# Patient Record
Sex: Male | Born: 1980
Health system: Southern US, Community
[De-identification: ages and names within clinical notes are randomized; demographics above are authoritative.]

## PROBLEM LIST (undated history)

## (undated) DIAGNOSIS — Z21 Asymptomatic human immunodeficiency virus [HIV] infection status: Secondary | ICD-10-CM

## (undated) DIAGNOSIS — F17201 Nicotine dependence, unspecified, in remission: Secondary | ICD-10-CM

## (undated) DIAGNOSIS — B20 Human immunodeficiency virus [HIV] disease: Secondary | ICD-10-CM

## (undated) DIAGNOSIS — Z889 Allergy status to unspecified drugs, medicaments and biological substances status: Secondary | ICD-10-CM

## (undated) HISTORY — DX: Allergy status to unspecified drugs, medicaments and biological substances: Z88.9

## (undated) HISTORY — DX: Nicotine dependence, unspecified, in remission: F17.201

## (undated) HISTORY — PX: OTHER SURGICAL HISTORY: SHX169

## (undated) HISTORY — DX: Asymptomatic human immunodeficiency virus (hiv) infection status: Z21

## (undated) HISTORY — DX: Human immunodeficiency virus (HIV) disease: B20

---

## 2014-05-07 ENCOUNTER — Ambulatory Visit (INDEPENDENT_AMBULATORY_CARE_PROVIDER_SITE_OTHER): Payer: PRIVATE HEALTH INSURANCE | Admitting: Nurse Practitioner

## 2014-05-07 ENCOUNTER — Ambulatory Visit: Payer: Self-pay | Admitting: Nurse Practitioner

## 2014-05-07 ENCOUNTER — Encounter: Payer: Self-pay | Admitting: Nurse Practitioner

## 2014-05-07 VITALS — BP 134/79 | HR 71 | Temp 98.0°F | Resp 18 | Ht 66.5 in | Wt 185.0 lb

## 2014-05-07 DIAGNOSIS — B20 Human immunodeficiency virus [HIV] disease: Secondary | ICD-10-CM

## 2014-05-07 MED ORDER — EMTRICITAB-RILPIVIR-TENOFOV DF 200-25-300 MG PO TABS: 1.0000 | ORAL_TABLET | Freq: Every day | ORAL | Status: DC

## 2014-05-07 NOTE — Progress Notes (Signed)
Pre visit review using our clinic review tool, if applicable. No additional management support is needed unless otherwise documented below in the visit note. 

## 2014-05-07 NOTE — Patient Instructions (Signed)
We will request records.   I will refer to infectious disease. They will call you to schedule appointment.  Develop lifelong habits of exercise most days of the week: take a 30 minute walk. The benefits include weight loss, lower risk for heart disease, diabetes, stroke, high blood pressure, lower rates of depression & dementia, better sleep quality & bone health.  Nice to meet you!

## 2014-05-07 NOTE — Progress Notes (Signed)
Subjective:     Kevin Keller is a 33 y.o. male and is here to establish care. The patient reports problems - he is currently treated for HIV/AIDS. He is on complera. Historical treatment was at [facility] Dr Kevin Keller in [place]. He had labs completed 03/2014 & brought those for review today. He has 10 days left of meds and is very concerned about running out. He feels well. WBC 3.9, Cd4 376, Cd8 679. HAV reactive-pt not sure if had Hep A vaccine, thinks had Hep B series. No hep B or C screen in labs. Tb Gold neg, GC & syphillis neg. CMET & lipids OK. He is working FT on weekends & going to school during the week. He is studying nursing at [employer].  History   Social History  . Marital Status: Single    Spouse Name: N/A    Number of Children: 0  . Years of Education: N/A   Occupational History  .     Social History Main Topics  . Smoking status: Light Tobacco Smoker  . Smokeless tobacco: Not on file  . Alcohol Use: Yes     Comment: occasionally  . Drug Use: Not on file  . Sexual Activity: Not on file   Other Topics Concern  . Not on file   Social History Narrative   Kevin Keller works FT at [employer]. He is also a Theatre stage manager at [employer].      He lives w/2 roommates.   He recently re-located from [place].         Health Maintenance  Topic Date Due  . Influenza Vaccine  04/04/2015  . Tetanus/tdap  05/07/2021    The following portions of the patient's history were reviewed and updated as appropriate: allergies, current medications, past family history, past medical history, past social history, past surgical history and problem list.  Review of Systems Pertinent items are noted in HPI.   Objective:    BP 134/79  Pulse 71  Temp(Src) 98 F (36.7 C) (Oral)  Resp 18  Ht 5' 6.5" (1.689 m)  Wt 185 lb (83.915 kg)  BMI 29.42 kg/m2  SpO2 99% General appearance: alert, cooperative, appears stated age and no distress Head: Normocephalic,  without obvious abnormality, atraumatic Eyes: negative findings: lids and lashes normal, conjunctivae and sclerae normal and wearing glasses Ears: L tm vessels slightly injected, bones visible . RTM nml Throat: lips, mucosa, and tongue normal; teeth and gums normal Lungs: clear to auscultation bilaterally Heart: regular rate and rhythm, S1, S2 normal, no murmur, click, rub or gallop Abdomen: soft, non-tender; bowel sounds normal; no masses,  no organomegaly Lymph nodes: Cervical, supraclavicular, and axillary nodes normal.    Assessment:  1. Human immunodeficiency virus (HIV) disease - Ambulatory referral to Infectious Disease - Emtricitab-Rilpivir-Tenofovir (COMPLERA) 200-25-300 MG TABS; Take 1 tablet by mouth daily.  Dispense: 30 tablet; Refill: 1  F/u 6 weeks.

## 2014-05-11 ENCOUNTER — Telehealth: Payer: Self-pay

## 2014-05-11 NOTE — Telephone Encounter (Signed)
Primary Care referral requesting new HIV appointment.    Message left on voice mail to call our office and schedule intake.   Laurell Josephs, RN

## 2014-06-10 ENCOUNTER — Other Ambulatory Visit: Payer: PRIVATE HEALTH INSURANCE

## 2014-06-10 DIAGNOSIS — B20 Human immunodeficiency virus [HIV] disease: Secondary | ICD-10-CM

## 2014-06-10 LAB — CBC WITH DIFFERENTIAL/PLATELET
BASOS ABS: 0 10*3/uL (ref 0.0–0.1)
Basophils Relative: 1 % (ref 0–1)
EOS PCT: 2 % (ref 0–5)
Eosinophils Absolute: 0.1 10*3/uL (ref 0.0–0.7)
HEMATOCRIT: 47.1 % (ref 39.0–52.0)
Hemoglobin: 16.3 g/dL (ref 13.0–17.0)
Lymphocytes Relative: 37 % (ref 12–46)
Lymphs Abs: 1.5 10*3/uL (ref 0.7–4.0)
MCH: 32.6 pg (ref 26.0–34.0)
MCHC: 34.6 g/dL (ref 30.0–36.0)
MCV: 94.2 fL (ref 78.0–100.0)
MONO ABS: 0.4 10*3/uL (ref 0.1–1.0)
Monocytes Relative: 10 % (ref 3–12)
NEUTROS ABS: 2 10*3/uL (ref 1.7–7.7)
Neutrophils Relative %: 50 % (ref 43–77)
PLATELETS: 224 10*3/uL (ref 150–400)
RBC: 5 MIL/uL (ref 4.22–5.81)
RDW: 13.2 % (ref 11.5–15.5)
WBC: 4 10*3/uL (ref 4.0–10.5)

## 2014-06-11 LAB — COMPREHENSIVE METABOLIC PANEL
ALK PHOS: 74 U/L (ref 39–117)
ALT: 34 U/L (ref 0–53)
AST: 21 U/L (ref 0–37)
Albumin: 4.1 g/dL (ref 3.5–5.2)
BUN: 13 mg/dL (ref 6–23)
CO2: 29 mEq/L (ref 19–32)
Calcium: 9.1 mg/dL (ref 8.4–10.5)
Chloride: 104 mEq/L (ref 96–112)
Creat: 0.96 mg/dL (ref 0.50–1.35)
Glucose, Bld: 84 mg/dL (ref 70–99)
POTASSIUM: 4.1 meq/L (ref 3.5–5.3)
Sodium: 141 mEq/L (ref 135–145)
Total Bilirubin: 0.5 mg/dL (ref 0.2–1.2)
Total Protein: 7.1 g/dL (ref 6.0–8.3)

## 2014-06-11 LAB — T-HELPER CELL (CD4) - (RCID CLINIC ONLY)
CD4 % Helper T Cell: 28 % — ABNORMAL LOW (ref 33–55)
CD4 T CELL ABS: 480 /uL (ref 400–2700)

## 2014-06-11 LAB — HIV-1 RNA ULTRAQUANT REFLEX TO GENTYP+: HIV 1 RNA Quant: <20 copies/mL (ref <20)

## 2014-06-18 ENCOUNTER — Ambulatory Visit (INDEPENDENT_AMBULATORY_CARE_PROVIDER_SITE_OTHER): Payer: PRIVATE HEALTH INSURANCE | Admitting: Nurse Practitioner

## 2014-06-18 ENCOUNTER — Encounter: Payer: Self-pay | Admitting: Nurse Practitioner

## 2014-06-18 VITALS — BP 122/76 | HR 71 | Temp 98.4°F | Ht 66.5 in | Wt 186.2 lb

## 2014-06-18 DIAGNOSIS — R05 Cough: Secondary | ICD-10-CM

## 2014-06-18 DIAGNOSIS — F17210 Nicotine dependence, cigarettes, uncomplicated: Secondary | ICD-10-CM | POA: Insufficient documentation

## 2014-06-18 DIAGNOSIS — Z23 Encounter for immunization: Secondary | ICD-10-CM

## 2014-06-18 DIAGNOSIS — R059 Cough, unspecified: Secondary | ICD-10-CM

## 2014-06-18 DIAGNOSIS — Z720 Tobacco use: Secondary | ICD-10-CM

## 2014-06-18 DIAGNOSIS — Z020 Encounter for examination for admission to educational institution: Secondary | ICD-10-CM | POA: Insufficient documentation

## 2014-06-18 DIAGNOSIS — Z0000 Encounter for general adult medical examination without abnormal findings: Secondary | ICD-10-CM

## 2014-06-18 MED ORDER — ALBUTEROL SULFATE HFA 108 (90 BASE) MCG/ACT IN AERS: 2.0000 | INHALATION_SPRAY | Freq: Two times a day (BID) | RESPIRATORY_TRACT | Status: DC | PRN

## 2014-06-18 NOTE — Patient Instructions (Signed)
Please keep appointment with infectious disease in 2 weeks.  Increase exercise to 30 minutes daily for best health.  Continue mucinex, use inhaler twice daily. Congestion from post-viral infection should clear within another week or so. There is likely some overlap of chronic bronchial irritation because you are a smoker.  Please let me know if you develop fever, fatigue, or chest pain with inspiration.   Schedule physical for next summer.  Nice to see you.

## 2014-06-18 NOTE — Progress Notes (Signed)
Subjective:     Kevin Keller is a [age] y.o. male presents for cough. He describes illness that started 2 weeks ago with red eye, progressed to nasal congestion, & cough. He denies fever, chest pain, fatigue. He used an antibiotic eye drop that cleared eye redness & drainage. He is using mucinex for cough. He continues to smoke about 5 cigarettes daily.  I reviewed his medical records since last visit: He was diagnosed with HIV (MSM) 12/'12. He has been on complera since then. His most recent viral load is undetectable. Absolute CD4 helper T cells are low normal. He had Neg STI testing 03/2014 including: Hep A (reactv-but he had Hep A vaccine 12/'12), RPR, GC, TB gold. He has HX of anal & genital warts; Hep B & C testing 2012-neg.  He has infectious disease appt. In 2 weeks.  The following portions of the patient's history were reviewed and updated as appropriate: allergies, current medications, past medical history, past social history, past surgical history and problem list.  Review of Systems Pertinent items are noted in HPI.    Objective:    BP 122/76  Pulse 71  Temp(Src) 98.4 F (36.9 C) (Temporal)  Ht 5' 6.5" (1.689 m)  Wt 186 lb 4 oz (84.482 kg)  BMI 29.61 kg/m2  SpO2 99% BP 122/76  Pulse 71  Temp(Src) 98.4 F (36.9 C) (Temporal)  Ht 5' 6.5" (1.689 m)  Wt 186 lb 4 oz (84.482 kg)  BMI 29.61 kg/m2  SpO2 99% General appearance: alert, cooperative, appears stated age and no distress Head: Normocephalic, without obvious abnormality, atraumatic Eyes: negative findings: lids and lashes normal, conjunctivae and sclerae normal and L lid slightly erythematous Ears: normal TM's and external ear canals both ears Throat: lips, mucosa, and tongue normal; teeth and gums normal Lungs: Wheeze LUL cleared w/deep breaths Heart: regular rate and rhythm, S1, S2 normal, no murmur, click, rub or gallop Lymph nodes: no cervical or Fanshawe LAD    Assessment:   1. Preventative health care Needs Hep A  #2 HDL low, increase exercise to 30 minutes daily. Lipids again 03/2015  2. Cough Post-viral w/smoker's cough overlap - albuterol (PROVENTIL HFA;VENTOLIN HFA) 108 (90 BASE) MCG/ACT inhaler; Inhale 2 puffs into the lungs 2 (two) times daily as needed for wheezing.  Dispense: 1 Inhaler; Refill: 4  3. Need for hepatitis A immunization - Hepatitis A vaccine adult IM  4. Light cigarette smoker (1-9 cigarettes per day)  F/u for CPE July, 2016.

## 2014-06-18 NOTE — Progress Notes (Signed)
Pre visit review using our clinic review tool, if applicable. No additional management support is needed unless otherwise documented below in the visit note. 

## 2014-06-21 ENCOUNTER — Telehealth: Payer: Self-pay | Admitting: Nurse Practitioner

## 2014-06-21 NOTE — Telephone Encounter (Signed)
emmi emailed °

## 2014-07-05 ENCOUNTER — Encounter: Payer: Self-pay | Admitting: Internal Medicine

## 2014-07-05 ENCOUNTER — Encounter: Payer: Self-pay | Admitting: *Deleted

## 2014-07-05 ENCOUNTER — Ambulatory Visit (INDEPENDENT_AMBULATORY_CARE_PROVIDER_SITE_OTHER): Payer: PRIVATE HEALTH INSURANCE | Admitting: Internal Medicine

## 2014-07-05 VITALS — BP 125/77 | HR 66 | Temp 97.7°F | Ht 67.0 in | Wt 186.2 lb

## 2014-07-05 DIAGNOSIS — B20 Human immunodeficiency virus [HIV] disease: Secondary | ICD-10-CM

## 2014-07-05 DIAGNOSIS — Z889 Allergy status to unspecified drugs, medicaments and biological substances status: Secondary | ICD-10-CM

## 2014-07-05 MED ORDER — EMTRICITAB-RILPIVIR-TENOFOV DF 200-25-300 MG PO TABS: 1.0000 | ORAL_TABLET | Freq: Every day | ORAL | Status: DC

## 2014-07-05 NOTE — Progress Notes (Signed)
Patient ID: Kevin Keller Erven, male   DOB: 03-16-1981, 33 y.o.   MRN: [id number]          Patient Active Problem List   Diagnosis Date Noted  . Human immunodeficiency virus (HIV) disease 05/07/2014    Priority: High  . H/O seasonal allergies 07/05/2014  . Preventative health care 06/18/2014  . Light cigarette smoker (1-9 cigarettes per day) 06/18/2014    Patient's Medications  New Prescriptions   No medications on file  Previous Medications   ALBUTEROL (PROVENTIL HFA;VENTOLIN HFA) 108 (90 BASE) MCG/ACT INHALER    Inhale 2 puffs into the lungs 2 (two) times daily as needed for wheezing.   EMTRICITAB-RILPIVIR-TENOFOVIR (COMPLERA) 200-25-300 MG TABS    Take 1 tablet by mouth daily.  Modified Medications   No medications on file  Discontinued Medications   No medications on file    Subjective: Kevin Keller is in for his first visit today. He is referred for ongoing management of his HIV infection. He was first diagnosed in 2003. He does not remember what his CD4 count was but states that he was told that it was "very low". He remembers having fever at that time but was not diagnosed with any specific opportunistic infection. He states that he did not start antiretroviral therapy until 2008. He says that between 2003 and 2008 he bounced around a lot with different jobs and relationships. In 2012 he started on Complera. He recalls that his CD4 count was over 350 at that time. He does not remember what his viral load was. Complera is the only medication he has ever taken. He takes it in the evening with food. He has no problems tolerating it. He currently has health insurance through his job but he is using a [employer] co-pay card to help him afford his Complera.  He moved here from [place] one year ago to start taking classes in anticipation of entering nursing school. He is currently at [employer]. 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 He is currently in  a monogamous, stable relationship with his husband. His husband is HIV negative. His husband is fully aware of his infection and they use condoms consistently.  Review of Systems: A comprehensive review of systems was negative.  Past Medical History  Diagnosis Date  . HIV (human immunodeficiency virus infection)   . H/O seasonal allergies     History  Substance Use Topics  . Smoking status: Light Tobacco Smoker  . Smokeless tobacco: Not on file     Comment: pack lasts 4-5 days  . Alcohol Use: 0.0 oz/week    0 Not specified per week     Comment: occasionally    Family History  Problem Relation Age of Onset  . Cancer Mother     breast  . Heart disease Father 358  . Cancer Paternal Aunt     No Known Allergies  Objective: Temp: 97.7 F (36.5 C) (11/02 1403) Temp Source: Oral (11/02 1403) BP: 125/77 mmHg (11/02 1403) Pulse Rate: 66 (11/02 1403) Body mass index is 29.16 kg/(m^2).  General: He is talkative and in good spirits Oral: he has a partial upper plate and a tongue stud. Skin: no rash Lungs: clear Cor: regular S1 and S2 with no murmur Abdomen: slightly obese but soft and nontender. There are no palpable masses Joints and extremities: normal Neuro: alert with normal speech and conversation. He wears glasses. Mood and affect: Normal and appropriate  Lab Results  Lab Results  Component Value Date   WBC 4.0 06/10/2014   HGB 16.3 06/10/2014   HCT 47.1 06/10/2014   MCV 94.2 06/10/2014   PLT 224 06/10/2014    Lab Results  Component Value Date   CREATININE 0.96 06/10/2014   BUN 13 06/10/2014   NA 141 06/10/2014   K 4.1 06/10/2014   CL 104 06/10/2014   CO2 29 06/10/2014    Lab Results  Component Value Date   ALT 34 06/10/2014   AST 21 06/10/2014   ALKPHOS 74 06/10/2014   BILITOT 0.5 06/10/2014    No results found for: CHOL, HDL, LDLCALC, LDLDIRECT, TRIG, CHOLHDL  Lab Results HIV 1 RNA QUANT (copies/mL)  Date Value  06/10/2014 <20   CD4 T CELL ABS  (/uL)  Date Value  06/10/2014 480     Assessment: Hermen's HIV infection is under perfect control. I will continue Complera and see him back every 6 months.  I talked to him at length today about the importance of cigarette cessation. He appears motivated to quit and has been decreasing the amount of cigarettes he smokes. However, he has not set a quit date. His husband smokes and Kevin Keller does not feel he is interested in trying to quit with him.  Plan: 1. Continue Complera 2. Cigarette cessation and provided 3. Follow-up here after lab work in 6 months   [name] AstersJohn Lucky Trotta, MD [facility] [phone] pager   [phone] cell 07/05/2014, 2:46 PM

## 2014-07-05 NOTE — Progress Notes (Signed)
Patient ID: Kevin LeventhalWilliam Kuras, male   DOB: 27-May-1981, 33 y.o.   MRN: [id number] [facility] [place], [place].  Needing MD office notes for Dr. [name] AstersJohn Campbell.

## 2014-07-05 NOTE — Addendum Note (Signed)
Addended by: Jennet MaduroESTRIDGE, DENISE D on: 07/05/2014 03:10 PM   Modules accepted: Orders

## 2014-07-06 ENCOUNTER — Encounter: Payer: Self-pay | Admitting: Nurse Practitioner

## 2014-07-06 DIAGNOSIS — Z8619 Personal history of other infectious and parasitic diseases: Secondary | ICD-10-CM | POA: Insufficient documentation

## 2014-10-11 ENCOUNTER — Other Ambulatory Visit: Payer: Self-pay | Admitting: Infectious Diseases

## 2014-10-11 DIAGNOSIS — B20 Human immunodeficiency virus [HIV] disease: Secondary | ICD-10-CM

## 2014-11-03 ENCOUNTER — Telehealth: Payer: Self-pay | Admitting: *Deleted

## 2014-11-03 NOTE — Telephone Encounter (Signed)
Patient is requesting medication Adderall 5 mg to help him stay more focused and more attentive at school or while studying. Patient wanted you to know that he used to be on Ritalin when he was young. Please advise?

## 2014-11-03 NOTE — Telephone Encounter (Signed)
I will be happy to refer him to [facility] Dr Marisue BrooklynAmy Stevenson for eval & treatment. Let me know if he wishes to proceed.

## 2014-11-05 ENCOUNTER — Other Ambulatory Visit: Payer: Self-pay | Admitting: Nurse Practitioner

## 2014-11-05 DIAGNOSIS — F988 Other specified behavioral and emotional disorders with onset usually occurring in childhood and adolescence: Secondary | ICD-10-CM

## 2014-11-05 NOTE — Telephone Encounter (Signed)
Patient would like to be referred to [facility]. Patient wants to know if [facility] is part of [facility]?

## 2014-11-05 NOTE — Telephone Encounter (Signed)
I do not think it is part of [facility]

## 2014-12-08 ENCOUNTER — Other Ambulatory Visit: Payer: Self-pay | Admitting: Internal Medicine

## 2015-01-05 ENCOUNTER — Other Ambulatory Visit: Payer: PRIVATE HEALTH INSURANCE

## 2015-01-05 DIAGNOSIS — Z113 Encounter for screening for infections with a predominantly sexual mode of transmission: Secondary | ICD-10-CM

## 2015-01-05 DIAGNOSIS — Z79899 Other long term (current) drug therapy: Secondary | ICD-10-CM

## 2015-01-05 DIAGNOSIS — B20 Human immunodeficiency virus [HIV] disease: Secondary | ICD-10-CM

## 2015-01-05 DIAGNOSIS — Z1159 Encounter for screening for other viral diseases: Secondary | ICD-10-CM

## 2015-01-05 LAB — CBC
HEMATOCRIT: 46.9 % (ref 39.0–52.0)
HEMOGLOBIN: 16.1 g/dL (ref 13.0–17.0)
MCH: 32.6 pg (ref 26.0–34.0)
MCHC: 34.3 g/dL (ref 30.0–36.0)
MCV: 94.9 fL (ref 78.0–100.0)
MPV: 10 fL (ref 8.6–12.4)
Platelets: 260 10*3/uL (ref 150–400)
RBC: 4.94 MIL/uL (ref 4.22–5.81)
RDW: 13.3 % (ref 11.5–15.5)
WBC: 5.2 10*3/uL (ref 4.0–10.5)

## 2015-01-05 LAB — COMPREHENSIVE METABOLIC PANEL
ALBUMIN: 4.1 g/dL (ref 3.5–5.2)
ALT: 26 U/L (ref 0–53)
AST: 21 U/L (ref 0–37)
Alkaline Phosphatase: 64 U/L (ref 39–117)
BUN: 14 mg/dL (ref 6–23)
CALCIUM: 9.4 mg/dL (ref 8.4–10.5)
CHLORIDE: 103 meq/L (ref 96–112)
CO2: 28 mEq/L (ref 19–32)
CREATININE: 0.91 mg/dL (ref 0.50–1.35)
Glucose, Bld: 78 mg/dL (ref 70–99)
Potassium: 4 mEq/L (ref 3.5–5.3)
Sodium: 139 mEq/L (ref 135–145)
Total Bilirubin: 0.5 mg/dL (ref 0.2–1.2)
Total Protein: 6.9 g/dL (ref 6.0–8.3)

## 2015-01-05 LAB — LIPID PANEL
CHOL/HDL RATIO: 5.2 ratio
Cholesterol: 165 mg/dL (ref 0–200)
HDL: 32 mg/dL — AB (ref >=40)
LDL Cholesterol: 78 mg/dL (ref 0–99)
TRIGLYCERIDES: 277 mg/dL — AB (ref <150)
VLDL: 55 mg/dL — ABNORMAL HIGH (ref 0–40)

## 2015-01-06 LAB — RPR

## 2015-01-06 LAB — HEPATITIS C ANTIBODY: HCV AB: NEGATIVE

## 2015-01-06 LAB — HIV-1 RNA QUANT-NO REFLEX-BLD
HIV 1 RNA Quant: <20 copies/mL (ref <20)
HIV-1 RNA Quant, Log: <1.3 {Log} (ref <1.30)

## 2015-01-07 LAB — T-HELPER CELL (CD4) - (RCID CLINIC ONLY)
CD4 T CELL HELPER: 27 % — AB (ref 33–55)
CD4 T Cell Abs: 550 /uL (ref 400–2700)

## 2015-01-20 ENCOUNTER — Encounter: Payer: Self-pay | Admitting: Internal Medicine

## 2015-01-20 ENCOUNTER — Ambulatory Visit (INDEPENDENT_AMBULATORY_CARE_PROVIDER_SITE_OTHER): Payer: PRIVATE HEALTH INSURANCE | Admitting: Internal Medicine

## 2015-01-20 DIAGNOSIS — B20 Human immunodeficiency virus [HIV] disease: Secondary | ICD-10-CM | POA: Diagnosis not present

## 2015-01-20 MED ORDER — EMTRICITAB-RILPIVIR-TENOFOV AF 200-25-25 MG PO TABS: 1.0000 | ORAL_TABLET | Freq: Every day | ORAL | Status: DC

## 2015-01-20 NOTE — Progress Notes (Signed)
Patient ID: Kevin Keller, male   DOB: May 05, 1981, 34 y.o.   MRN: [id number]          Patient Active Problem List   Diagnosis Date Noted  . Human immunodeficiency virus (HIV) disease 05/07/2014    Priority: High  . History of genital warts 07/06/2014  . History of chlamydia infection 07/06/2014  . H/O seasonal allergies 07/05/2014  . Preventative health care 06/18/2014  . Light cigarette smoker (1-9 cigarettes per day) 06/18/2014    Patient's Medications  New Prescriptions   EMTRICITAB-RILPIVIR-TENOFOV AF (ODEFSEY) 200-25-25 MG TABS    Take 1 tablet by mouth daily.  Previous Medications   ALBUTEROL (PROVENTIL HFA;VENTOLIN HFA) 108 (90 BASE) MCG/ACT INHALER    Inhale 2 puffs into the lungs 2 (two) times daily as needed for wheezing.   LORATADINE (CLARITIN) 10 MG TABLET    Take 10 mg by mouth daily.  Modified Medications   No medications on file  Discontinued Medications   COMPLERA 200-25-300 MG TABS    TAKE 1 TABLET BY MOUTH DAILY    Subjective: Kevin Keller is in for his routine HIV follow-up visit. He denies missing any doses of Complera. He takes it each evening with dinner. He has no problems tolerating it. He took over-the-counter loratadine for several weeks last month when he was bothered by seasonal allergies but has not needed it more recently. He has not needed his albuterol inhaler. He feels like he's having some problems after having his wisdom teeth removed several years ago. He is not having any pain. He has one dental implant. He has not seen a dentist recently.  He is very excited to tell me that he has started working out and watching how much food he eats. He shows me pictures on his cell phone of how he looked several months ago compared with how he looks now. He has certainly become much more muscular and his abdominal fat has decreased significantly. He states that he is feeling much better.     Review of Systems: Constitutional: negative Eyes: negative Ears,  nose, mouth, throat, and face: negative Respiratory: negative Cardiovascular: negative Gastrointestinal: negative Genitourinary:negative  Past Medical History  Diagnosis Date  . HIV (human immunodeficiency virus infection)   . H/O seasonal allergies     History  Substance Use Topics  . Smoking status: Light Tobacco Smoker  . Smokeless tobacco: Not on file     Comment: pack lasts 4-5 days  . Alcohol Use: 0.0 oz/week    0 Standard drinks or equivalent per week     Comment: occasionally    Family History  Problem Relation Age of Onset  . Cancer Mother     breast  . Heart disease Father [id number]  . Cancer Paternal Aunt     No Known Allergies  Objective: Temp: 98.5 F (36.9 C) (05/19 1059) Temp Source: Oral (05/19 1059) BP: 147/87 mmHg (05/19 1059) Pulse Rate: 69 (05/19 1059) Body mass index is 28.89 kg/(m^2).  Genhis weight is down 2 pounds to 184  Skin: no oropharyngeal lesions. No obvious dental problems. He has a tongue stud and a right ear ring  Lungs:  clear  Cor: regular S1 and S2 with no murmurs  Abdomen:  decreased abdominal fat, soft and nontender  mood: Normal   Lab Results Lab Results  Component Value Date   WBC 5.2 01/05/2015   HGB 16.1 01/05/2015   HCT 46.9 01/05/2015   MCV 94.9 01/05/2015   PLT 260 01/05/2015  Lab Results  Component Value Date   CREATININE 0.91 01/05/2015   BUN 14 01/05/2015   NA 139 01/05/2015   K 4.0 01/05/2015   CL 103 01/05/2015   CO2 28 01/05/2015    Lab Results  Component Value Date   ALT 26 01/05/2015   AST 21 01/05/2015   ALKPHOS 64 01/05/2015   BILITOT 0.5 01/05/2015    Lab Results  Component Value Date   CHOL 165 01/05/2015   HDL 32* 01/05/2015   LDLCALC 78 01/05/2015   TRIG 277* 01/05/2015   CHOLHDL 5.2 01/05/2015    Lab Results HIV 1 RNA QUANT (copies/mL)  Date Value  01/05/2015 <20  06/10/2014 <20   CD4 T CELL ABS (/uL)  Date Value  01/05/2015 550  06/10/2014 480     Assessment: His HIV  infection remains under excellent control. I will change him to the new preparation called Odefsey.  I encouraged him on his new exercise regimen and lifestyle modifications.  His allergies and asthma are dormant at this time.  Plan: 1. [facility] 2. Dental clinic referral  3. Follow-up after lab work in 6 months    [name] AstersJohn Dametrius Sanjuan, MD [facility] [phone] pager   [phone] cell 01/20/2015, 11:32 AM

## 2015-02-10 ENCOUNTER — Telehealth: Payer: Self-pay | Admitting: *Deleted

## 2015-02-10 NOTE — Telephone Encounter (Signed)
Pt had several questions about his new medication, Odefsey.  Pt to take medication with food just like Complera.  Pt asked about size of the pill being much smaller than Complera and RN assured pt that that was one of the pluses about the new medication.  Answered all of pt's question.

## 2015-03-21 ENCOUNTER — Ambulatory Visit (INDEPENDENT_AMBULATORY_CARE_PROVIDER_SITE_OTHER): Payer: PRIVATE HEALTH INSURANCE | Admitting: Family Medicine

## 2015-03-21 ENCOUNTER — Encounter: Payer: Self-pay | Admitting: Family Medicine

## 2015-03-21 ENCOUNTER — Encounter: Payer: PRIVATE HEALTH INSURANCE | Admitting: Nurse Practitioner

## 2015-03-21 VITALS — BP 127/85 | HR 54 | Temp 97.9°F | Resp 16 | Ht 65.5 in | Wt 178.0 lb

## 2015-03-21 DIAGNOSIS — Z0000 Encounter for general adult medical examination without abnormal findings: Secondary | ICD-10-CM

## 2015-03-21 LAB — TSH: TSH: 0.85 u[IU]/mL (ref 0.35–4.50)

## 2015-03-21 NOTE — Progress Notes (Signed)
Office Note 03/21/2015  CC:  Chief Complaint  Patient presents with  . Annual Exam    Pt is fasting.    HPI:  Kevin Keller is a 34 y.o. White male who is here to get health maintenance exam, he is fasting. I reviewed all recent labs with him: see below.    He is UTD regarding vaccines for HIV+ patient.  Has been working out a lot and eating much healthier since lat visit here.  He is pleased with wt loss.  Eye exam w/in last near was normal except he is near-sighted. He is due to see dentist, has appt this week.  Past Medical History  Diagnosis Date  . HIV (human immunodeficiency virus infection)   . H/O seasonal allergies     No past surgical history on file.  Family History  Problem Relation Age of Onset  . Cancer Mother     breast  . Heart disease Father [phone]  . Cancer Paternal Aunt     History   Social History  . Marital Status: Single    Spouse Name: N/A  . Number of Children: 0  . Years of Education: N/A   Occupational History  .     Social History Main Topics  . Smoking status: Light Tobacco Smoker  . Smokeless tobacco: Not on file     Comment: pack lasts 4-5 days  . Alcohol Use: 0.0 oz/week    0 Standard drinks or equivalent per week     Comment: occasionally  . Drug Use: Not on file  . Sexual Activity: No     Comment: decined condoms   Other Topics Concern  . Not on file   Social History Narrative   Kevin Keller works FT at [employer]. 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      He lives w/2 roommates.   He recently re-located from [place].          Outpatient Prescriptions Prior to Visit  Medication Sig Dispense Refill  . albuterol (PROVENTIL HFA;VENTOLIN HFA) 108 (90 BASE) MCG/ACT inhaler Inhale 2 puffs into the lungs 2 (two) times daily as needed for wheezing. 1 Inhaler 4  . Emtricitab-Rilpivir-Tenofov AF (ODEFSEY) 200-25-25 MG TABS Take 1 tablet by mouth daily. 30 tablet 11  . loratadine (CLARITIN) 10 MG tablet Take  10 mg by mouth daily.     No facility-administered medications prior to visit.    No Known Allergies  ROS Review of Systems  Constitutional: Negative for fever, chills, appetite change and fatigue.  HENT: Negative for congestion, dental problem, ear pain and sore throat.   Eyes: Negative for discharge, redness and visual disturbance.  Respiratory: Negative for cough, chest tightness, shortness of breath and wheezing.   Cardiovascular: Negative for chest pain, palpitations and leg swelling.  Gastrointestinal: Negative for nausea, vomiting, abdominal pain, diarrhea and blood in stool.  Genitourinary: Negative for dysuria, urgency, frequency, hematuria, flank pain and difficulty urinating.  Musculoskeletal: Negative for myalgias, back pain, joint swelling, arthralgias and neck stiffness.  Skin: Negative for pallor and rash.  Neurological: Negative for dizziness, speech difficulty, weakness and headaches.  Hematological: Negative for adenopathy. Does not bruise/bleed easily.  Psychiatric/Behavioral: Negative for confusion and sleep disturbance. The patient is not nervous/anxious.     PE; Blood pressure 127/85, pulse 54, temperature 97.9 F (36.6 C), temperature source Oral, resp. rate 16, height 5' 5.5" (1.664 m), weight 178 lb (80.74 kg), SpO2 98 %. Gen: Alert, well appearing.  Patient is oriented to person, place, time, and situation. AFFECT: pleasant, lucid thought and speech. ENT: Ears: EACs clear, normal epithelium.  TMs with good light reflex and landmarks bilaterally.  Eyes: no injection, icteris, swelling, or exudate.  EOMI, PERRLA. Nose: no drainage or turbinate edema/swelling.  No injection or focal lesion.  Mouth: lips without lesion/swelling.  Oral mucosa pink and moist.  Dentition intact and without obvious caries or gingival swelling.  Oropharynx without erythema, exudate, or swelling.  Neck: supple/nontender.  No LAD, mass, or TM.  Carotid pulses 2+ bilaterally, without  bruits. CV: RRR, no m/r/g.   LUNGS: CTA bilat, nonlabored resps, good aeration in all lung fields. ABD: soft, NT, ND, BS normal.  No hepatospenomegaly or mass.  No bruits. EXT: no clubbing, cyanosis, or edema.  Musculoskeletal: no joint swelling, erythema, warmth, or tenderness.  ROM of all joints intact. Skin - no sores or suspicious lesions or rashes or color changes  Pertinent labs:   Lab Results  Component Value Date   WBC 5.2 01/05/2015   HGB 16.1 01/05/2015   HCT 46.9 01/05/2015   MCV 94.9 01/05/2015   PLT 260 01/05/2015   Lab Results  Component Value Date   CREATININE 0.91 01/05/2015   BUN 14 01/05/2015   NA 139 01/05/2015   K 4.0 01/05/2015   CL 103 01/05/2015   CO2 28 01/05/2015   Lab Results  Component Value Date   ALT 26 01/05/2015   AST 21 01/05/2015   ALKPHOS 64 01/05/2015   BILITOT 0.5 01/05/2015   Lab Results  Component Value Date   CHOL 165 01/05/2015   Lab Results  Component Value Date   HDL 32* 01/05/2015   Lab Results  Component Value Date   LDLCALC 78 01/05/2015   Lab Results  Component Value Date   TRIG 277* 01/05/2015   Lab Results  Component Value Date   CHOLHDL 5.2 01/05/2015    ASSESSMENT AND PLAN:   Health maintenance exam: Reviewed age and gender appropriate health maintenance issues (prudent diet, regular exercise, health risks of tobacco and excessive alcohol, use of seatbelts, fire alarms in home, use of sunscreen).  Also reviewed age and gender appropriate health screening as well as vaccine recommendations. The only routine CPE lab due today is TSH, which I drew today. He is UTD on vaccines. He will keep routine appts with his HIV MD and his dentist.  An After Visit Summary was printed and given to the patient.  FOLLOW UP:  Return in about 6 months (around 09/21/2015) for routine chronic illness f/u.

## 2015-03-21 NOTE — Progress Notes (Signed)
Pre visit review using our clinic review tool, if applicable. No additional management support is needed unless otherwise documented below in the visit note. 

## 2015-07-12 ENCOUNTER — Other Ambulatory Visit: Payer: PRIVATE HEALTH INSURANCE

## 2015-07-12 DIAGNOSIS — B20 Human immunodeficiency virus [HIV] disease: Secondary | ICD-10-CM

## 2015-07-12 LAB — CBC
HEMATOCRIT: 47.8 % (ref 39.0–52.0)
HEMOGLOBIN: 16.6 g/dL (ref 13.0–17.0)
MCH: 33.4 pg (ref 26.0–34.0)
MCHC: 34.7 g/dL (ref 30.0–36.0)
MCV: 96.2 fL (ref 78.0–100.0)
MPV: 9.8 fL (ref 8.6–12.4)
Platelets: 247 10*3/uL (ref 150–400)
RBC: 4.97 MIL/uL (ref 4.22–5.81)
RDW: 12.7 % (ref 11.5–15.5)
WBC: 8.7 10*3/uL (ref 4.0–10.5)

## 2015-07-12 LAB — COMPREHENSIVE METABOLIC PANEL
ALBUMIN: 4.3 g/dL (ref 3.6–5.1)
ALK PHOS: 68 U/L (ref 40–115)
ALT: 22 U/L (ref 9–46)
AST: 18 U/L (ref 10–40)
BILIRUBIN TOTAL: 0.7 mg/dL (ref 0.2–1.2)
BUN: 19 mg/dL (ref 7–25)
CO2: 29 mmol/L (ref 20–31)
Calcium: 9.7 mg/dL (ref 8.6–10.3)
Chloride: 98 mmol/L (ref 98–110)
Creat: 0.9 mg/dL (ref 0.60–1.35)
Glucose, Bld: 77 mg/dL (ref 65–99)
Potassium: 3.9 mmol/L (ref 3.5–5.3)
SODIUM: 139 mmol/L (ref 135–146)
TOTAL PROTEIN: 7.2 g/dL (ref 6.1–8.1)

## 2015-07-13 LAB — HIV-1 RNA QUANT-NO REFLEX-BLD: HIV 1 RNA Quant: <20 copies/mL (ref <20)

## 2015-07-13 LAB — T-HELPER CELL (CD4) - (RCID CLINIC ONLY)
CD4 % Helper T Cell: 31 % — ABNORMAL LOW (ref 33–55)
CD4 T Cell Abs: 470 /uL (ref 400–2700)

## 2015-07-26 ENCOUNTER — Encounter: Payer: Self-pay | Admitting: Internal Medicine

## 2015-07-26 ENCOUNTER — Ambulatory Visit (INDEPENDENT_AMBULATORY_CARE_PROVIDER_SITE_OTHER): Payer: PRIVATE HEALTH INSURANCE | Admitting: Internal Medicine

## 2015-07-26 VITALS — BP 132/82 | HR 54 | Temp 98.6°F | Ht 67.0 in | Wt 183.0 lb

## 2015-07-26 DIAGNOSIS — B20 Human immunodeficiency virus [HIV] disease: Secondary | ICD-10-CM

## 2015-07-26 DIAGNOSIS — Z23 Encounter for immunization: Secondary | ICD-10-CM

## 2015-07-26 DIAGNOSIS — Z720 Tobacco use: Secondary | ICD-10-CM

## 2015-07-26 DIAGNOSIS — Z889 Allergy status to unspecified drugs, medicaments and biological substances status: Secondary | ICD-10-CM

## 2015-07-26 DIAGNOSIS — F17210 Nicotine dependence, cigarettes, uncomplicated: Secondary | ICD-10-CM

## 2015-07-26 NOTE — Assessment & Plan Note (Signed)
His recent acute respiratory illness may have been due to seasonal allergies as it started shortly after he was blowing leaves in his yard. However, I cannot rule out a respiratory infection. At this time his symptoms have resolved.

## 2015-07-26 NOTE — Addendum Note (Signed)
Addended by: [name] AstersAMPBELL, Cruzito Standre on: 07/26/2015 05:12 PM   Modules accepted: Orders

## 2015-07-26 NOTE — Progress Notes (Addendum)
Patient Active Problem List   Diagnosis Date Noted  . Human immunodeficiency virus (HIV) disease (HCC) 05/07/2014    Priority: High  . History of genital warts 07/06/2014  . History of chlamydia infection 07/06/2014  . H/O seasonal allergies 07/05/2014  . Preventative health care 06/18/2014  . Light cigarette smoker (1-9 cigarettes per day) 06/18/2014    Patient's Medications  New Prescriptions   No medications on file  Previous Medications   ALBUTEROL (PROVENTIL HFA;VENTOLIN HFA) 108 (90 BASE) MCG/ACT INHALER    Inhale 2 puffs into the lungs 2 (two) times daily as needed for wheezing.   EMTRICITAB-RILPIVIR-TENOFOV AF (ODEFSEY) 200-25-25 MG TABS    Take 1 tablet by mouth daily.   LORATADINE (CLARITIN) 10 MG TABLET    Take 10 mg by mouth daily.  Modified Medications   No medications on file  Discontinued Medications   No medications on file    Subjective: Thos is in for his routine HIV follow-up visit. He never misses a single dose of his Odefsey and has no problems tolerating it. He recently developed severe, acute sinus congestion and chest congestion. He does not recall having any fever but he did have 3 drinks she sweats. He developed a dry cough and had episodes of severe bilateral chest pain associated with smoking cigarettes. He was worried that he was in the early stages of COPD and decided to quit smoking. He has only had a few cigarettes in the past 3 weeks. He states that he tends to crave a cigarette when he drinks alcohol or after he has been sexually active. His significant other is a heavy smoker and still smokes around him. He has not asked him to smoke outside. He states that he does not believe he is addicted to nicotine but does feel like he craves the menthol. He has started using menthol supplements to see if that will help with the cravings. He is worried that he could've had pneumonia. He is still working on his studies to enter nursing school. He has  been very busy and is not getting any regular exercise.   Review of Systems: Review of Systems  Constitutional: Positive for diaphoresis. Negative for fever, chills, weight loss and malaise/fatigue.  HENT: Negative for sore throat.   Respiratory: Positive for cough. Negative for sputum production and shortness of breath.   Cardiovascular: Positive for chest pain.  Gastrointestinal: Negative for nausea, vomiting and diarrhea.  Genitourinary: Negative for dysuria and frequency.  Musculoskeletal: Negative for myalgias and joint pain.  Skin: Negative for rash.  Neurological: Negative for focal weakness.  Psychiatric/Behavioral: Negative for depression and substance abuse. The patient is not nervous/anxious.     Past Medical History  Diagnosis Date  . HIV (human immunodeficiency virus infection) (HCC)   . H/O seasonal allergies     Social History  Substance Use Topics  . Smoking status: Former Games developer  . Smokeless tobacco: None     Comment: pack lasts 4-5 days  . Alcohol Use: 0.0 oz/week    0 Standard drinks or equivalent per week     Comment: occasionally    Family History  Problem Relation Age of Onset  . Cancer Mother     breast  . Heart disease Father 65  . Cancer Paternal Aunt     No Known Allergies  Objective:  Filed Vitals:   07/26/15 1402  BP: 132/82  Pulse: 54  Temp: 98.6 F (37 C)  TempSrc: Oral  Height:  (1.702 m)  Weight: 183 lb (83.008 kg)   Body mass index is 28.66 kg/(m^2).  Physical Exam  Constitutional: He is oriented to person, place, and time.  He is very animated and talkative today. He is in no distress.  HENT:  Mouth/Throat: No oropharyngeal exudate.  Eyes: Conjunctivae are normal.  Cardiovascular: Normal rate and regular rhythm.   No murmur heard. Pulmonary/Chest: Effort normal and breath sounds normal. He has no wheezes. He has no rales. He exhibits no tenderness.  Abdominal: Soft. He exhibits no mass. There is no tenderness.    Musculoskeletal: Normal range of motion.  Neurological: He is alert and oriented to person, place, and time.  Skin: No rash noted.  Psychiatric: Mood and affect normal.    Lab Results Lab Results  Component Value Date   WBC 8.7 07/12/2015   HGB 16.6 07/12/2015   HCT 47.8 07/12/2015   MCV 96.2 07/12/2015   PLT 247 07/12/2015    Lab Results  Component Value Date   CREATININE 0.90 07/12/2015   BUN 19 07/12/2015   NA 139 07/12/2015   K 3.9 07/12/2015   CL 98 07/12/2015   CO2 29 07/12/2015    Lab Results  Component Value Date   ALT 22 07/12/2015   AST 18 07/12/2015   ALKPHOS 68 07/12/2015   BILITOT 0.7 07/12/2015    Lab Results  Component Value Date   CHOL 165 01/05/2015   HDL 32* 01/05/2015   LDLCALC 78 01/05/2015   TRIG 277* 01/05/2015   CHOLHDL 5.2 01/05/2015    Lab Results HIV 1 RNA QUANT (copies/mL)  Date Value  07/12/2015 <20  01/05/2015 <20  06/10/2014 <20   CD4 T CELL ABS (/uL)  Date Value  07/12/2015 470  01/05/2015 550  06/10/2014 480      Problem List Items Addressed This Visit      High   Human immunodeficiency virus (HIV) disease (HCC)    His HIV infection remains under excellent control. He will continue his current antiretroviral regimen and follow-up after lab work in 6 months.      Relevant Orders   T-helper cell (CD4)- ([facility] clinic only)   HIV 1 RNA quant-no reflex-bld   CBC   Comprehensive metabolic panel   Lipid panel   RPR     Unprioritized   H/O seasonal allergies - Primary    His recent acute respiratory illness may have been due to seasonal allergies as it started shortly after he was blowing leaves in his yard. However, I cannot rule out a respiratory infection. At this time his symptoms have resolved.      Light cigarette smoker (1-9 cigarettes per day)    I spoke to him at length today about a cigarette cessation plan. He also talked to our pharmacist, Casilda Carls.            [name] Asters, MD [facility]  for Infectious Disease [facility] [phone] pager   [phone] cell 07/26/2015, 2:38 PM  Addendum:  I did get immunization records from his primary care physician. They showed that he received 1 hepatitis A vaccine on 08/04/2011 and again on 06/18/2014. There was no documentation of prior hepatitis B vaccination. I do not have any lab work showing his serologic status for hepatitis A or B. This will need to be obtained with his next blood work.  [name] Asters, MD [facility] for Infectious Disease [facility] [phone] pager  [phone] cell 07/26/2015, 5:11 PM

## 2015-07-26 NOTE — Assessment & Plan Note (Signed)
His HIV infection remains under excellent control. He will continue his current antiretroviral regimen and follow-up after lab work in 6 months.

## 2015-07-26 NOTE — Assessment & Plan Note (Signed)
I spoke to him at length today about a cigarette cessation plan. He also talked to our pharmacist, Casilda Carlsaylor Stone.

## 2015-09-21 ENCOUNTER — Ambulatory Visit: Payer: PRIVATE HEALTH INSURANCE | Admitting: Family Medicine

## 2015-09-29 ENCOUNTER — Telehealth: Payer: Self-pay | Admitting: *Deleted

## 2015-09-29 NOTE — Telephone Encounter (Signed)
Prior Authorization for [facility] completed and sent to [employer] at [phone], confirmation received. Patient informed this was done and advised that it may take 48 to 72 hours before we find out if it was approved. Kevin Keller

## 2015-10-03 NOTE — Telephone Encounter (Signed)
PAP approved for [facility], pharmacy notified, will notify pt.

## 2016-01-10 ENCOUNTER — Other Ambulatory Visit: Payer: PRIVATE HEALTH INSURANCE

## 2016-01-10 DIAGNOSIS — B20 Human immunodeficiency virus [HIV] disease: Secondary | ICD-10-CM

## 2016-01-11 LAB — HIV-1 RNA QUANT-NO REFLEX-BLD
HIV 1 RNA Quant: <20 copies/mL (ref <20)
HIV-1 RNA Quant, Log: <1.3 Log copies/mL (ref <1.30)

## 2016-01-11 LAB — LIPID PANEL
CHOLESTEROL: 200 mg/dL (ref 125–200)
HDL: 32 mg/dL — ABNORMAL LOW (ref >=40)
TRIGLYCERIDES: 439 mg/dL — AB (ref <150)
Total CHOL/HDL Ratio: 6.3 Ratio — ABNORMAL HIGH (ref <=5.0)

## 2016-01-11 LAB — CBC
HEMATOCRIT: 48.5 % (ref 38.5–50.0)
HEMOGLOBIN: 16.6 g/dL (ref 13.2–17.1)
MCH: 33.8 pg — ABNORMAL HIGH (ref 27.0–33.0)
MCHC: 34.2 g/dL (ref 32.0–36.0)
MCV: 98.8 fL (ref 80.0–100.0)
MPV: 9.5 fL (ref 7.5–12.5)
PLATELETS: 255 10*3/uL (ref 140–400)
RBC: 4.91 MIL/uL (ref 4.20–5.80)
RDW: 13.3 % (ref 11.0–15.0)
WBC: 4.3 10*3/uL (ref 3.8–10.8)

## 2016-01-11 LAB — COMPREHENSIVE METABOLIC PANEL
ALBUMIN: 4.2 g/dL (ref 3.6–5.1)
ALK PHOS: 53 U/L (ref 40–115)
ALT: 17 U/L (ref 9–46)
AST: 19 U/L (ref 10–40)
BUN: 19 mg/dL (ref 7–25)
CO2: 26 mmol/L (ref 20–31)
Calcium: 8.9 mg/dL (ref 8.6–10.3)
Chloride: 104 mmol/L (ref 98–110)
Creat: 1.26 mg/dL (ref 0.60–1.35)
Glucose, Bld: 73 mg/dL (ref 65–99)
POTASSIUM: 4.3 mmol/L (ref 3.5–5.3)
Sodium: 141 mmol/L (ref 135–146)
TOTAL PROTEIN: 6.8 g/dL (ref 6.1–8.1)
Total Bilirubin: 0.6 mg/dL (ref 0.2–1.2)

## 2016-01-11 LAB — HEPATITIS A ANTIBODY, TOTAL: Hep A Total Ab: REACTIVE — AB

## 2016-01-11 LAB — RPR

## 2016-01-11 LAB — HEPATITIS B SURFACE ANTIGEN: Hepatitis B Surface Ag: NEGATIVE

## 2016-01-11 LAB — T-HELPER CELL (CD4) - (RCID CLINIC ONLY)
CD4 % Helper T Cell: 30 % — ABNORMAL LOW (ref 33–55)
CD4 T CELL ABS: 410 /uL (ref 400–2700)

## 2016-01-11 LAB — HEPATITIS B SURFACE ANTIBODY,QUALITATIVE: HEP B S AB: POSITIVE — AB

## 2016-01-24 ENCOUNTER — Ambulatory Visit (INDEPENDENT_AMBULATORY_CARE_PROVIDER_SITE_OTHER): Payer: PRIVATE HEALTH INSURANCE | Admitting: Internal Medicine

## 2016-01-24 ENCOUNTER — Encounter: Payer: Self-pay | Admitting: Internal Medicine

## 2016-01-24 VITALS — BP 144/83 | HR 69 | Temp 98.3°F | Wt 184.2 lb

## 2016-01-24 DIAGNOSIS — E785 Hyperlipidemia, unspecified: Secondary | ICD-10-CM | POA: Diagnosis not present

## 2016-01-24 DIAGNOSIS — Z720 Tobacco use: Secondary | ICD-10-CM | POA: Diagnosis not present

## 2016-01-24 DIAGNOSIS — F17210 Nicotine dependence, cigarettes, uncomplicated: Secondary | ICD-10-CM

## 2016-01-24 DIAGNOSIS — B20 Human immunodeficiency virus [HIV] disease: Secondary | ICD-10-CM

## 2016-01-24 NOTE — Assessment & Plan Note (Signed)
I told him that perhaps his greatest long-term health concern is his cigarette smoking. I encouraged him to consider quitting.

## 2016-01-24 NOTE — Assessment & Plan Note (Signed)
His triglycerides have gone up. He is not getting any regular exercise and he is slightly overweight. He would like to try lifestyle modification. I will recheck his lipids before his visit in 6 months.

## 2016-01-24 NOTE — Assessment & Plan Note (Signed)
His infection remains under excellent control. He will continue Odefsey and follow-up after lab work in 6 months. I talked him about being extremely careful with future partner selection and condom use. He was given condoms today.

## 2016-01-24 NOTE — Progress Notes (Signed)
Patient Active Problem List   Diagnosis Date Noted  . Human immunodeficiency virus (HIV) disease (HCC) 05/07/2014    Priority: High  . Dyslipidemia 01/24/2016  . History of genital warts 07/06/2014  . History of chlamydia infection 07/06/2014  . H/O seasonal allergies 07/05/2014  . Preventative health care 06/18/2014  . Light cigarette smoker (1-9 cigarettes per day) 06/18/2014    Patient's Medications  New Prescriptions   No medications on file  Previous Medications   ALBUTEROL (PROVENTIL HFA;VENTOLIN HFA) 108 (90 BASE) MCG/ACT INHALER    Inhale 2 puffs into the lungs 2 (two) times daily as needed for wheezing.   EMTRICITAB-RILPIVIR-TENOFOV AF (ODEFSEY) 200-25-25 MG TABS    Take 1 tablet by mouth daily.   LORATADINE (CLARITIN) 10 MG TABLET    Take 10 mg by mouth daily. Reported on 01/24/2016  Modified Medications   No medications on file  Discontinued Medications   No medications on file    Subjective: Kevin Keller is in for his routine HIV follow-up visit. He denies missing any doses of his Odefsey. Currently, he is not taking any other medications. He recently broke up with his partner of 5 years. He states that they are still good friends. He denies feeling anxious or depressed. He continues to smoke cigarettes and has no current plan to quit. 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 He has not been getting any regular exercise.   Review of Systems: Review of Systems  Constitutional: Negative for fever, chills, weight loss, malaise/fatigue and diaphoresis.  HENT: Negative for sore throat.   Respiratory: Negative for cough, sputum production and shortness of breath.   Cardiovascular: Negative for chest pain.  Gastrointestinal: Negative for nausea, vomiting and diarrhea.  Genitourinary: Negative for dysuria.  Musculoskeletal: Negative for myalgias and joint pain.  Skin: Negative for rash.  Neurological: Negative for dizziness and headaches.    Psychiatric/Behavioral: Negative for depression and substance abuse. The patient is not nervous/anxious.     Past Medical History  Diagnosis Date  . HIV (human immunodeficiency virus infection) (HCC)   . H/O seasonal allergies     Social History  Substance Use Topics  . Smoking status: Current Every Day Smoker -- 0.50 packs/day for 13 years    Types: Cigarettes  . Smokeless tobacco: Never Used     Comment: pack lasts 4-5 days  . Alcohol Use: 0.0 oz/week    0 Standard drinks or equivalent per week     Comment: occasionally    Family History  Problem Relation Age of Onset  . Cancer Mother     breast  . Heart disease Father [id number]  . Cancer Paternal Aunt     No Known Allergies  Objective:  Filed Vitals:   01/24/16 1046 01/24/16 1054  BP: 148/88 144/83  Pulse: 61 69  Temp: 98.3 F (36.8 C)   TempSrc: Oral   Weight: 184 lb 4 oz (83.575 kg)    Body mass index is 28.85 kg/(m^2).  Physical Exam  Constitutional: He is oriented to person, place, and time.  He is in good spirits.  HENT:  Mouth/Throat: No oropharyngeal exudate.  He has a tongue stud.  Eyes: Conjunctivae are normal.  Cardiovascular: Normal rate and regular rhythm.   No murmur heard. Pulmonary/Chest: Breath sounds normal.  Abdominal: Soft. He exhibits no mass. There is no tenderness.  Musculoskeletal: Normal range of motion.  Neurological: He is alert and oriented to person, place, and  time.  Skin: No rash noted.  Psychiatric: Mood and affect normal.    Lab Results Lab Results  Component Value Date   WBC 4.3 01/10/2016   HGB 16.6 01/10/2016   HCT 48.5 01/10/2016   MCV 98.8 01/10/2016   PLT 255 01/10/2016    Lab Results  Component Value Date   CREATININE 1.26 01/10/2016   BUN 19 01/10/2016   NA 141 01/10/2016   K 4.3 01/10/2016   CL 104 01/10/2016   CO2 26 01/10/2016    Lab Results  Component Value Date   ALT 17 01/10/2016   AST 19 01/10/2016   ALKPHOS 53 01/10/2016   BILITOT 0.6  01/10/2016    Lab Results  Component Value Date   CHOL 200 01/10/2016   HDL 32* 01/10/2016   LDLCALC NOT CALC 01/10/2016   TRIG 439* 01/10/2016   CHOLHDL 6.3* 01/10/2016    Lab Results HIV 1 RNA QUANT (copies/mL)  Date Value  01/10/2016 <20  07/12/2015 <20  01/05/2015 <20   CD4 T CELL ABS (/uL)  Date Value  01/10/2016 410  07/12/2015 470  01/05/2015 550      Problem List Items Addressed This Visit      High   Human immunodeficiency virus (HIV) disease (HCC)    His infection remains under excellent control. He will continue Odefsey and follow-up after lab work in 6 months. I talked him about being extremely careful with future partner selection and condom use. He was given condoms today.      Relevant Orders   T-helper cell (CD4)- ([facility] clinic only)   HIV 1 RNA quant-no reflex-bld   CBC   Comprehensive metabolic panel     Unprioritized   Dyslipidemia    His triglycerides have gone up. He is not getting any regular exercise and he is slightly overweight. He would like to try lifestyle modification. I will recheck his lipids before his visit in 6 months.      Relevant Orders   Lipid panel   RPR   Light cigarette smoker (1-9 cigarettes per day) - Primary    I told him that perhaps his greatest long-term health concern is his cigarette smoking. I encouraged him to consider quitting.           [name] Asters, MD [facility] for Infectious Disease [facility] [phone] pager   [phone] cell 01/24/2016, 11:11 AM

## 2016-05-28 ENCOUNTER — Telehealth: Payer: Self-pay | Admitting: *Deleted

## 2016-05-28 NOTE — Telephone Encounter (Signed)
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 RN added patietn to the state registry with the immunization documentation that he provided on intake to [facility].  Patient will follow up with providers in [place] or his PCP for further Immunization needs.  Patient will pick up documentation from [facility]. Andree Coss,Jazlyn Tippens M, RN

## 2016-06-04 NOTE — Telephone Encounter (Signed)
Patient called back requesting that the immunization be faxed to [phone]. Faxed and confirmation received. A copy was also mailed to his home at his request.

## 2016-07-05 ENCOUNTER — Other Ambulatory Visit: Payer: PRIVATE HEALTH INSURANCE

## 2016-07-05 DIAGNOSIS — E785 Hyperlipidemia, unspecified: Secondary | ICD-10-CM

## 2016-07-05 DIAGNOSIS — B20 Human immunodeficiency virus [HIV] disease: Secondary | ICD-10-CM

## 2016-07-05 LAB — CBC
HCT: 49.3 % (ref 38.5–50.0)
HEMOGLOBIN: 16.7 g/dL (ref 13.2–17.1)
MCH: 33.9 pg — AB (ref 27.0–33.0)
MCHC: 33.9 g/dL (ref 32.0–36.0)
MCV: 100 fL (ref 80.0–100.0)
MPV: 9.8 fL (ref 7.5–12.5)
PLATELETS: 248 10*3/uL (ref 140–400)
RBC: 4.93 MIL/uL (ref 4.20–5.80)
RDW: 12.7 % (ref 11.0–15.0)
WBC: 4.6 10*3/uL (ref 3.8–10.8)

## 2016-07-06 LAB — COMPREHENSIVE METABOLIC PANEL
ALBUMIN: 4.2 g/dL (ref 3.6–5.1)
ALT: 19 U/L (ref 9–46)
AST: 18 U/L (ref 10–40)
Alkaline Phosphatase: 54 U/L (ref 40–115)
BILIRUBIN TOTAL: 0.7 mg/dL (ref 0.2–1.2)
BUN: 18 mg/dL (ref 7–25)
CHLORIDE: 103 mmol/L (ref 98–110)
CO2: 25 mmol/L (ref 20–31)
CREATININE: 1.06 mg/dL (ref 0.60–1.35)
Calcium: 9.5 mg/dL (ref 8.6–10.3)
Glucose, Bld: 98 mg/dL (ref 65–99)
Potassium: 3.9 mmol/L (ref 3.5–5.3)
SODIUM: 140 mmol/L (ref 135–146)
Total Protein: 7.3 g/dL (ref 6.1–8.1)

## 2016-07-06 LAB — LIPID PANEL
Cholesterol: 189 mg/dL (ref 125–200)
HDL: 35 mg/dL — ABNORMAL LOW (ref >=40)
LDL CALC: 110 mg/dL (ref <130)
TRIGLYCERIDES: 218 mg/dL — AB (ref <150)
Total CHOL/HDL Ratio: 5.4 Ratio — ABNORMAL HIGH (ref <=5.0)
VLDL: 44 mg/dL — AB (ref <30)

## 2016-07-06 LAB — T-HELPER CELL (CD4) - (RCID CLINIC ONLY)
CD4 T CELL ABS: 550 /uL (ref 400–2700)
CD4 T CELL HELPER: 33 % (ref 33–55)

## 2016-07-06 LAB — RPR

## 2016-07-10 LAB — HIV-1 RNA QUANT-NO REFLEX-BLD

## 2016-07-18 ENCOUNTER — Telehealth: Payer: Self-pay | Admitting: Internal Medicine

## 2016-07-18 NOTE — Telephone Encounter (Signed)
Kevin Keller called saying he can't make his appointment tomorrow to discuss his lab results in person. He's wondering if someone will call him today to discuss his results. Please give him a phone call regarding this. He initially asked to speak to Kevin Keller or Kevin Keller directly if possible.   Pt's ph# [phone] Thank you.

## 2016-07-19 ENCOUNTER — Other Ambulatory Visit: Payer: Self-pay | Admitting: Internal Medicine

## 2016-07-19 ENCOUNTER — Ambulatory Visit: Payer: PRIVATE HEALTH INSURANCE | Admitting: Internal Medicine

## 2016-07-19 DIAGNOSIS — B20 Human immunodeficiency virus [HIV] disease: Secondary | ICD-10-CM

## 2016-07-31 NOTE — Telephone Encounter (Signed)
Changing jobs soon.  Will need to come to talk with a [employer] Financial Counselor about signing up for [employer] Assistance for the 90-day waiting period for insurance when taking a new job.  Patient advised to call [facility] for an appointment with the Financial Counselor.  Pt verbalized understanding.

## 2016-08-24 ENCOUNTER — Telehealth: Payer: Self-pay | Admitting: *Deleted

## 2016-08-24 NOTE — Telephone Encounter (Signed)
Patient's pharmacy [facility] wants to mail patient's meds to his work and he does not want this to happen. His employer is going to be having a pharmacy inside his work as of 09/2016. 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 He asked if we had a sample of the Odefsey to hold him over until he can figure something out. We do have a sample and it was placed in Tammy's office for pick up next week. He will call first just in case the door is locked. Wendall MolaJacqueline Cockerham CMA

## 2016-08-29 ENCOUNTER — Telehealth: Payer: Self-pay | Admitting: *Deleted

## 2016-08-29 NOTE — Telephone Encounter (Signed)
Walk-in to [facility] - Question about recieving HIV rxes.  Patients' current jov is teling him that he MUST received his medications through their pharmacy not his current Mail Order through [facility].  This new rule goes in to effect September 03, 2016.  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  He uses a [employer] co-pay card with the [employer].  He is VERY concerned that his current employer will figure out from his medication that he is HIV positive.  He has recently taken the CNA2 exam and is waiting on receiving his CNA2 certification once this happens he is going to change jobs to a hospital that will allow his medication to to mailed to his home which is his preference.  The patient and this RN went through several scenarios which would give him some options.  First, he will receive another refill of his Odefsey prior to the beginning of 2018.  He was given a full bottle of Odefsey today here at [facility].  He will then have 60 days to obtain a new job and begin new health insurance.  He knows that he will be eligible for [employer] until his new job insurance begins.  He is going to start applying for CNA1 positions today and when his CNA2 comes through the Board of Nursing he can begin applying for CNA2 positions.  He was calmer at the end of this conversation and her developed a plan of action.

## 2016-09-22 ENCOUNTER — Telehealth: Payer: Self-pay | Admitting: *Deleted

## 2016-09-22 ENCOUNTER — Other Ambulatory Visit: Payer: Self-pay | Admitting: *Deleted

## 2016-09-22 DIAGNOSIS — B20 Human immunodeficiency virus [HIV] disease: Secondary | ICD-10-CM

## 2016-09-22 NOTE — Telephone Encounter (Signed)
Patient left voicemail in triage while [facility] was closed due to weather. He asked nursing to fax a new prescription [facility] [phone].  RN returned call Saturday, left message asking for pharmacy name to confirm where I am sending the prescription, as the 800 number does not pull up in our system. RN asked patient to call back Monday when [facility] is open. Andree CossHowell, Jafet Wissing M, RN

## 2016-09-24 ENCOUNTER — Telehealth: Payer: Self-pay | Admitting: *Deleted

## 2016-09-24 NOTE — Telephone Encounter (Signed)
Patient called because he was notified that he may be a contact for STD. Advised patient that because we are closed due to water damage in clinic that he should go to the health department for testing. Gave him the address and phone number and he agreed to this plan. Kevin MolaJacqueline Cockerham

## 2016-10-15 ENCOUNTER — Telehealth: Payer: Self-pay | Admitting: *Deleted

## 2016-10-15 DIAGNOSIS — B20 Human immunodeficiency virus [HIV] disease: Secondary | ICD-10-CM

## 2016-10-15 MED ORDER — EMTRICITAB-RILPIVIR-TENOFOV AF 200-25-25 MG PO TABS: 1.0000 | ORAL_TABLET | Freq: Every day | ORAL | 1 refills | Status: DC

## 2016-10-15 NOTE — Telephone Encounter (Signed)
Patient changing mail order pharmacies.

## 2016-10-17 ENCOUNTER — Telehealth: Payer: Self-pay | Admitting: *Deleted

## 2016-10-17 NOTE — Telephone Encounter (Signed)
Patient called stating he needed a prior authorization for a 90 day supply of the odefsey. I called [employer] insurance and spoke with Ophelia 800 [phone] and she said it is not an option for 90 days, but he did need a new PA for the 30 day. Thirty day was approved through 09/28/17. Advised patient of this and he was given different information. He will call [employer] again.

## 2016-11-27 ENCOUNTER — Telehealth: Payer: Self-pay | Admitting: *Deleted

## 2016-11-27 NOTE — Telephone Encounter (Signed)
Patient called requesting a copy of his immunization record. Printed and placed up front for pick up. Kevin Keller

## 2017-01-01 ENCOUNTER — Other Ambulatory Visit: Payer: PRIVATE HEALTH INSURANCE

## 2017-01-01 DIAGNOSIS — B20 Human immunodeficiency virus [HIV] disease: Secondary | ICD-10-CM

## 2017-01-01 LAB — CMP 10231
AG RATIO: 1.3 ratio (ref 1.0–2.5)
ALK PHOS: 70 U/L (ref 40–115)
ALT: 22 U/L (ref 9–46)
AST: 17 U/L (ref 10–40)
Albumin: 4 g/dL (ref 3.6–5.1)
BILIRUBIN TOTAL: 0.8 mg/dL (ref 0.2–1.2)
BUN/Creatinine Ratio: 14.8 Ratio (ref 6–22)
BUN: 17 mg/dL (ref 7–25)
CO2: 27 mmol/L (ref 20–31)
Calcium: 9 mg/dL (ref 8.6–10.3)
Chloride: 103 mmol/L (ref 98–110)
Creat: 1.15 mg/dL (ref 0.60–1.35)
GFR, EST NON AFRICAN AMERICAN: 81 mL/min (ref >=60)
Globulin: 3 g/dL (ref 1.9–3.7)
Glucose, Bld: 105 mg/dL — ABNORMAL HIGH (ref 65–99)
Potassium: 4 mmol/L (ref 3.5–5.3)
Sodium: 140 mmol/L (ref 135–146)
Total Protein: 7 g/dL (ref 6.1–8.1)

## 2017-01-01 LAB — LIPID PANEL
CHOL/HDL RATIO: 5.2 ratio — AB (ref <5.0)
Cholesterol: 188 mg/dL (ref <200)
HDL: 36 mg/dL — AB (ref >40)
LDL CALC: 105 mg/dL — AB (ref <100)
Triglycerides: 237 mg/dL — ABNORMAL HIGH (ref <150)
VLDL: 47 mg/dL — ABNORMAL HIGH (ref <30)

## 2017-01-01 LAB — CBC
HEMATOCRIT: 45.3 % (ref 38.5–50.0)
Hemoglobin: 15.5 g/dL (ref 13.2–17.1)
MCH: 33.8 pg — AB (ref 27.0–33.0)
MCHC: 34.2 g/dL (ref 32.0–36.0)
MCV: 98.9 fL (ref 80.0–100.0)
MPV: 9.6 fL (ref 7.5–12.5)
Platelets: 273 10*3/uL (ref 140–400)
RBC: 4.58 MIL/uL (ref 4.20–5.80)
RDW: 12.6 % (ref 11.0–15.0)
WBC: 8 10*3/uL (ref 3.8–10.8)

## 2017-01-02 LAB — T-HELPER CELL (CD4) - (RCID CLINIC ONLY)
CD4 % Helper T Cell: 28 % — ABNORMAL LOW (ref 33–55)
CD4 T Cell Abs: 500 /uL (ref 400–2700)

## 2017-01-02 LAB — RPR

## 2017-01-03 LAB — HIV-1 RNA QUANT-NO REFLEX-BLD
HIV 1 RNA Quant: <20 copies/mL
HIV-1 RNA Quant, Log: <1.3 Log copies/mL

## 2017-01-10 ENCOUNTER — Telehealth: Payer: Self-pay | Admitting: *Deleted

## 2017-01-10 NOTE — Telephone Encounter (Signed)
Patient called, wanted to confirm that we had his new group ID number.  RN returned the call, left message asking him to call back with this information. Andree CossHowell, Nuel Dejaynes M, RN

## 2017-01-15 ENCOUNTER — Ambulatory Visit: Payer: PRIVATE HEALTH INSURANCE | Admitting: Internal Medicine

## 2017-01-17 ENCOUNTER — Ambulatory Visit (INDEPENDENT_AMBULATORY_CARE_PROVIDER_SITE_OTHER): Payer: 59 | Admitting: Internal Medicine

## 2017-01-17 ENCOUNTER — Encounter: Payer: Self-pay | Admitting: Internal Medicine

## 2017-01-17 DIAGNOSIS — F17210 Nicotine dependence, cigarettes, uncomplicated: Secondary | ICD-10-CM | POA: Diagnosis not present

## 2017-01-17 DIAGNOSIS — E669 Obesity, unspecified: Secondary | ICD-10-CM | POA: Insufficient documentation

## 2017-01-17 DIAGNOSIS — B20 Human immunodeficiency virus [HIV] disease: Secondary | ICD-10-CM | POA: Diagnosis not present

## 2017-01-17 DIAGNOSIS — E6609 Other obesity due to excess calories: Secondary | ICD-10-CM | POA: Diagnosis not present

## 2017-01-17 DIAGNOSIS — Z6831 Body mass index (BMI) 31.0-31.9, adult: Secondary | ICD-10-CM | POA: Diagnosis not present

## 2017-01-17 MED ORDER — EMTRICITAB-RILPIVIR-TENOFOV AF 200-25-25 MG PO TABS: 1.0000 | ORAL_TABLET | Freq: Every day | ORAL | 3 refills | Status: DC

## 2017-01-17 MED FILL — ODEFSEY 200-25-25 MG TABS: 200-25-25 | 30 days supply | Qty: 30 | Fill #0

## 2017-01-17 NOTE — Assessment & Plan Note (Signed)
His infection remains under excellent long-term control. He will continue Odefsey and follow-up after lab work in one year.
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
## 2017-01-17 NOTE — Assessment & Plan Note (Signed)
I talked him about his recent weight gain and the problems that could be associated with it. He has developed some hyperglycemia and dyslipidemia. He is not getting any regular exercise. I talked to him about a plan for lifestyle modification.

## 2017-01-17 NOTE — Assessment & Plan Note (Signed)
I congratulated him on his ability to stop smoking.

## 2017-01-22 ENCOUNTER — Encounter: Payer: Self-pay | Admitting: Family Medicine

## 2017-02-18 MED FILL — ODEFSEY 200-25-25 MG TABS: 200-25-25 | 30 days supply | Qty: 30 | Fill #1

## 2017-03-18 DIAGNOSIS — H52223 Regular astigmatism, bilateral: Secondary | ICD-10-CM | POA: Diagnosis not present

## 2017-03-18 DIAGNOSIS — H5203 Hypermetropia, bilateral: Secondary | ICD-10-CM | POA: Diagnosis not present

## 2017-03-18 MED FILL — ODEFSEY 200-25-25 MG TABS: 200-25-25 | 30 days supply | Qty: 30 | Fill #2

## 2017-04-22 MED FILL — ODEFSEY 200-25-25 MG TABS: 200-25-25 | 30 days supply | Qty: 30 | Fill #3

## 2017-05-20 MED FILL — ODEFSEY 200-25-25 MG TABS: 200-25-25 | 30 days supply | Qty: 30 | Fill #4

## 2017-06-19 MED FILL — ODEFSEY 200-25-25 MG TABS: 200-25-25 | 30 days supply | Qty: 30 | Fill #5

## 2017-06-26 ENCOUNTER — Ambulatory Visit (INDEPENDENT_AMBULATORY_CARE_PROVIDER_SITE_OTHER): Payer: 59 | Admitting: Internal Medicine

## 2017-06-26 ENCOUNTER — Encounter: Payer: Self-pay | Admitting: Internal Medicine

## 2017-06-26 ENCOUNTER — Other Ambulatory Visit (HOSPITAL_COMMUNITY)
Admission: RE | Admit: 2017-06-26 | Discharge: 2017-06-26 | Disposition: A | Discharge status: Home / Self Care | Payer: 59 | Source: Ambulatory Visit | Attending: Internal Medicine | Admitting: Internal Medicine

## 2017-06-26 DIAGNOSIS — J029 Acute pharyngitis, unspecified: Secondary | ICD-10-CM | POA: Diagnosis not present

## 2017-06-26 DIAGNOSIS — F17210 Nicotine dependence, cigarettes, uncomplicated: Secondary | ICD-10-CM | POA: Diagnosis not present

## 2017-06-26 DIAGNOSIS — B20 Human immunodeficiency virus [HIV] disease: Secondary | ICD-10-CM | POA: Insufficient documentation

## 2017-06-26 MED ORDER — EMTRICITAB-RILPIVIR-TENOFOV AF 200-25-25 MG PO TABS: 1.0000 | ORAL_TABLET | Freq: Every day | ORAL | 3 refills | Status: DC

## 2017-06-26 NOTE — Assessment & Plan Note (Signed)
He tells me that he was able to quit smoking after his last visit. I congratulated him.

## 2017-06-26 NOTE — Assessment & Plan Note (Signed)
There is a good chance that his mild persistent sore throat is due to allergies or recent viral infection, however I will also screen him for oropharyngeal (and all site) gonorrhea and chlamydia.

## 2017-06-26 NOTE — Assessment & Plan Note (Signed)
His adherence is perfect and as a result his infection has been under excellent long term control. He will continue Odefsey and get blood work today. I talked to him about the importance of limiting the number partners he has going forward. He will follow-up in 6 months.

## 2017-06-26 NOTE — Progress Notes (Signed)
Patient Active Problem List   Diagnosis Date Noted  . Human immunodeficiency virus (HIV) disease (HCC) 05/07/2014    Priority: High  . Sore throat 06/26/2017    Priority: Medium  . Obesity 01/17/2017  . Dyslipidemia 01/24/2016  . History of genital warts 07/06/2014  . History of chlamydia infection 07/06/2014  . H/O seasonal allergies 07/05/2014  . Preventative health care 06/18/2014  . Light cigarette smoker (1-9 cigarettes per day) 06/18/2014    Patient's Medications  New Prescriptions   No medications on file  Previous Medications   LORATADINE (CLARITIN) 10 MG TABLET    Take 10 mg by mouth daily. Reported on 01/24/2016  Modified Medications   Modified Medication Previous Medication   EMTRICITABINE-RILPIVIR-TENOFOVIR AF (ODEFSEY) 200-25-25 MG TABS TABLET emtricitabine-rilpivir-tenofovir AF (ODEFSEY) 200-25-25 MG TABS tablet      Take 1 tablet by mouth daily.    Take 1 tablet by mouth daily.  Discontinued Medications   ALBUTEROL (PROVENTIL HFA;VENTOLIN HFA) 108 (90 BASE) MCG/ACT INHALER    Inhale 2 puffs into the lungs 2 (two) times daily as needed for wheezing.    Subjective: Kevin Keller is seen on a work in basis. He says that he has been struggling with sinus congestion and sore throat for several weeks. Initially he thought he had a flare of seasonal allergies. He started using Chloraseptic nasal spray. He did not take loratadine or anything else for possible allergies. His congestion has improved but he's been left with a mild scratchy throat and some fullness in his throat for the past 2 weeks. He has not had any fever, chills or sweats. He has had no cough or shortness of breath.  He has been sexually active with 3 male partners since his last visit. They all know his HIV status. One of his partners has HIV and is on therapy. Another is on PrEP. The other is HIV negative but not on prep. They have all use condoms consistently but he is worried about the possibility  of sexually transmitted diseases. He would like to be tested today for "everything".  He has had no problems obtaining, taking or tolerating his Odefsey. He does not recall ever missing a dose.   Review of Systems: Review of Systems  Constitutional: Negative for chills, diaphoresis, fever, malaise/fatigue and weight loss.  HENT: Positive for congestion and sore throat.   Respiratory: Negative for cough, sputum production and shortness of breath.   Cardiovascular: Negative for chest pain.  Gastrointestinal: Negative for abdominal pain, diarrhea, heartburn, nausea and vomiting.  Genitourinary: Negative for dysuria and frequency.  Musculoskeletal: Negative for joint pain and myalgias.  Skin: Negative for rash.  Neurological: Negative for dizziness and headaches.  Psychiatric/Behavioral: Negative for depression and substance abuse. The patient is not nervous/anxious.     Past Medical History:  Diagnosis Date  . H/O seasonal allergies   . HIV (human immunodeficiency virus infection) (HCC)    Excellent long term control as of 01/2017 ID f/u (on [place]).    Social History  Substance Use Topics  . Smoking status: Former Smoker    Packs/day: 0.50    Years: 13.00    Types: Cigarettes  . Smokeless tobacco: Never Used  . Alcohol use 0.0 oz/week     Comment: occasionally    Family History  Problem Relation Age of Onset  . Cancer Mother        breast  . Heart disease Father 7  . Cancer Paternal  Aunt     No Known Allergies  Health Maintenance  Topic Date Due  . INFLUENZA VACCINE  04/03/2017  . TETANUS/TDAP  08/03/2021  . HIV Screening  Completed    Objective:  Vitals:   06/26/17 1533  BP: 135/88  Pulse: 73  Temp: 98.2 F (36.8 C)  TempSrc: Oral  Weight: 205 lb (93 kg)  Height: 5\' 7"  (1.702 m)   Body mass index is 32.11 kg/m.  Physical Exam  Constitutional: He is oriented to person, place, and time.  He appears a little worried but otherwise is in no distress.    HENT:  Mouth/Throat: No oropharyngeal exudate.  He has a tongue stud.  Eyes: Conjunctivae are normal.  Cardiovascular: Normal rate and regular rhythm.   No murmur heard. Pulmonary/Chest: Effort normal and breath sounds normal. He has no wheezes. He has no rales.  Abdominal: Soft. He exhibits no mass. There is no tenderness.  Musculoskeletal: Normal range of motion.  Lymphadenopathy:       Head (right side): No submental and no submandibular adenopathy present.       Head (left side): No submental and no submandibular adenopathy present.    He has no cervical adenopathy.  Neurological: He is alert and oriented to person, place, and time.  Skin: No rash noted.  Psychiatric: Mood and affect normal.    Lab Results Lab Results  Component Value Date   WBC 8.0 01/01/2017   HGB 15.5 01/01/2017   HCT 45.3 01/01/2017   MCV 98.9 01/01/2017   PLT 273 01/01/2017    Lab Results  Component Value Date   CREATININE 1.15 01/01/2017   BUN 17 01/01/2017   NA 140 01/01/2017   K 4.0 01/01/2017   CL 103 01/01/2017   CO2 27 01/01/2017    Lab Results  Component Value Date   ALT 22 01/01/2017   AST 17 01/01/2017   ALKPHOS 70 01/01/2017   BILITOT 0.8 01/01/2017    Lab Results  Component Value Date   CHOL 188 01/01/2017   HDL 36 (L) 01/01/2017   LDLCALC 105 (H) 01/01/2017   TRIG 237 (H) 01/01/2017   CHOLHDL 5.2 (H) 01/01/2017   Lab Results  Component Value Date   LABRPR NON REAC 01/01/2017   HIV 1 RNA Quant (copies/mL)  Date Value  01/01/2017 <20 NOT DETECTED  07/05/2016 <20  01/10/2016 <20   CD4 T Cell Abs (/uL)  Date Value  01/01/2017 500  07/05/2016 550  01/10/2016 410     Problem List Items Addressed This Visit      High   Human immunodeficiency virus (HIV) disease (HCC)    His adherence is perfect and as a result his infection has been under excellent long term control. He will continue Odefsey and get blood work today. I talked to him about the importance of  limiting the number partners he has going forward. He will follow-up in 6 months.      Relevant Medications   emtricitabine-rilpivir-tenofovir AF (ODEFSEY) 200-25-25 MG TABS tablet   Other Relevant Orders   T-helper cell (CD4)- ([facility] clinic only)   HIV 1 RNA quant-no reflex-bld   Cytology (oral, anal, urethral) ancillary only   Cytology (oral, anal, urethral) ancillary only   Urine cytology ancillary only   T-helper cell (CD4)- ([facility] clinic only)   HIV 1 RNA quant-no reflex-bld   CBC   Comprehensive metabolic panel   Lipid panel   RPR     Medium   Sore throat  There is a good chance that his mild persistent sore throat is due to allergies or recent viral infection, however I will also screen him for oropharyngeal (and all site) gonorrhea and chlamydia.        Unprioritized   Light cigarette smoker (1-9 cigarettes per day)    He tells me that he was able to quit smoking after his last visit. I congratulated him.           [name] Asters, MD [facility] [phone] pager   [phone] cell 06/26/2017, 4:35 PM

## 2017-06-27 ENCOUNTER — Encounter: Payer: Self-pay | Admitting: Family Medicine

## 2017-06-28 LAB — T-HELPER CELL (CD4) - (RCID CLINIC ONLY)
CD4 % Helper T Cell: 30 % — ABNORMAL LOW (ref 33–55)
CD4 T CELL ABS: 660 /uL (ref 400–2700)

## 2017-06-28 LAB — CYTOLOGY, (ORAL, ANAL, URETHRAL) ANCILLARY ONLY
CHLAMYDIA, DNA PROBE: NEGATIVE
Chlamydia: NEGATIVE
NEISSERIA GONORRHEA: NEGATIVE
NEISSERIA GONORRHEA: NEGATIVE

## 2017-06-28 LAB — HIV-1 RNA QUANT-NO REFLEX-BLD
HIV 1 RNA QUANT: DETECTED {copies}/mL — AB
HIV-1 RNA Quant, Log: <1.3 Log copies/mL — AB

## 2017-06-28 LAB — URINE CYTOLOGY ANCILLARY ONLY
CHLAMYDIA, DNA PROBE: NEGATIVE
NEISSERIA GONORRHEA: NEGATIVE

## 2017-07-04 ENCOUNTER — Telehealth: Payer: Self-pay | Admitting: *Deleted

## 2017-07-04 NOTE — Telephone Encounter (Signed)
Patient called with concerns that his viral load now shows detected at <20. Last results were the same, but stated not detectable. Please advise

## 2017-07-04 NOTE — Telephone Encounter (Signed)
Reassure him that that is just a confusing way the lab reports his findings but his viral load is still undetectable at less than 20. He is doing very well. He does not need to make any changes.

## 2017-07-08 ENCOUNTER — Encounter: Payer: Self-pay | Admitting: *Deleted

## 2017-07-08 NOTE — Telephone Encounter (Signed)
Sent patient a my chart message with Dr. Blair Dolphinampbell's response. Annice PihJackie

## 2017-07-26 MED FILL — ODEFSEY 200-25-25 MG TABS: 200-25-25 | 30 days supply | Qty: 30 | Fill #6

## 2017-08-20 MED FILL — ODEFSEY 200-25-25 MG TABS: 200-25-25 | 30 days supply | Qty: 30 | Fill #7

## 2017-09-19 MED FILL — ODEFSEY 200-25-25 MG TABS: 200-25-25 | 30 days supply | Qty: 30 | Fill #8

## 2017-10-18 MED FILL — ODEFSEY 200-25-25 MG TABS: 200-25-25 | 30 days supply | Qty: 30 | Fill #9

## 2017-11-18 MED FILL — ODEFSEY 200-25-25 MG TABS: 200-25-25 | 30 days supply | Qty: 30 | Fill #10

## 2017-11-20 ENCOUNTER — Encounter: Payer: Self-pay | Admitting: Internal Medicine

## 2017-11-20 ENCOUNTER — Ambulatory Visit (INDEPENDENT_AMBULATORY_CARE_PROVIDER_SITE_OTHER): Payer: 59 | Admitting: Family

## 2017-11-20 ENCOUNTER — Encounter: Payer: Self-pay | Admitting: Family

## 2017-11-20 DIAGNOSIS — B356 Tinea cruris: Secondary | ICD-10-CM

## 2017-11-20 MED ORDER — TERBINAFINE HCL 250 MG PO TABS: 250.0000 mg | ORAL_TABLET | Freq: Every day | ORAL | 0 refills | Status: DC

## 2017-11-20 MED FILL — TERBINAFINE HCL 250 MG TABS: 250 | 14 days supply | Qty: 14 | Fill #0

## 2017-11-20 NOTE — Patient Instructions (Signed)
Nice to meet you!  Start taking the Lamisil. Can stop at 7 days if your symptoms are resolved, if not continue to take the remaining 7 doses.  Continue to use an OTC cream such as lamisil or Tinactin.   Cotton underwear  Change wet clothes when able.  Try BodyGlide to help reduce chaffing. Available at most sporting good stores.   Jock Itch Jock itch (tinea cruris) is a fungal infection of the skin in the groin area. It is sometimes called ringworm, even though it is not caused by worms. It is caused by a fungus, which is a type of germ that thrives in dark, damp places. Jock itch causes a rash and itching in the groin and upper thigh area. It usually goes away in 2-3 weeks with treatment. What are the causes? The fungus that causes jock itch may be spread by:  Touching a fungus infection elsewhere on your body-such as athlete's foot-and then touching your groin area.  Sharing towels or clothing with an infected person.  What increases the risk? Jock itch is most common in men and adolescent boys. This condition is more likely to develop from:  Being in hot, humid climates.  Wearing tight-fitting clothing or wet bathing suits for long periods of time.  Participating in sports.  Being overweight.  Having diabetes.  What are the signs or symptoms? Symptoms of jock itch may include:  A red, pink, or brown rash in the groin area. The rash may spread to the thighs, anus, and buttocks.  Dry and scaly skin on or around the rash.  Itchiness.  How is this diagnosed? Most often, a health care provider can make the diagnosis by looking at your rash. Sometimes, a scraping of the infected skin will be taken. This sample may be tested by looking at it under a microscope or by trying to grow the fungus from the sample (culture). How is this treated? Treatment for this condition may include:  Antifungal medicine to kill the fungus. This may be in various forms: ? Skin cream or  ointment. ? Medicine taken by mouth.  Skin cream or ointment to reduce the itching.  Compresses or medicated powders to dry the infected skin.  Follow these instructions at home:  Take medicines only as directed by your health care provider. Apply skin creams or ointments exactly as directed.  Wear loose-fitting clothing. ? Men should wear cotton boxer shorts. ? Women should wear cotton underwear.  Change your underwear every day to keep your groin dry.  Avoid hot baths.  Dry your groin area well after bathing. ? Use a separate towel to dry your groin area. This will help to prevent a spreading of the infection to other areas of your body.  Do not scratch the affected area.  Do not share towels with other people. Contact a health care provider if:  Your rash does not improve or it gets worse after 2 weeks of treatment.  Your rash is spreading.  Your rash returns after treatment is finished.  You have a fever.  You have redness, swelling, or pain in the area around your rash.  You have fluid, blood, or pus coming from your rash.  Your have your rash for more than 4 weeks. This information is not intended to replace advice given to you by your health care provider. Make sure you discuss any questions you have with your health care provider. Document Released: 08/10/2002 Document Revised: 01/26/2016 Document Reviewed: 06/01/2014 Elsevier Interactive Patient  Education  2018 Elsevier Inc.  

## 2017-11-20 NOTE — Progress Notes (Signed)
Subjective:    Patient ID: Kevin Keller, male    DOB: [birth year], 37 y.o.   MRN: [id number]  Chief Complaint  Patient presents with  . Rash    in groin area about 2 weeks worse at night     HPI:  Kevin Keller is a 37 y.o. male who presents today for an acute office visit.  This is a new problem. Associated symptoms of a rash located in his groin has been going on for about 2 weeks. Symptoms are generally worse at night. Indicates that he started working out including swimming sauna and has lost weight. Started off as two small dots and described as red and itching. Modifying factors include Gold Bond, Lotrimin and Tinactin for a couple of weeks which have helped very little.   Wt Readings from Last 3 Encounters:  11/20/17 209 lb (94.8 kg)  06/26/17 205 lb (93 kg)  01/17/17 204 lb (92.5 kg)      No Known Allergies    Outpatient Medications Prior to Visit  Medication Sig Dispense Refill  . emtricitabine-rilpivir-tenofovir AF (ODEFSEY) 200-25-25 MG TABS tablet Take 1 tablet by mouth daily. 90 tablet 3  . loratadine (CLARITIN) 10 MG tablet Take 10 mg by mouth daily. Reported on 01/24/2016     No facility-administered medications prior to visit.      Past Medical History:  Diagnosis Date  . H/O seasonal allergies   . HIV (human immunodeficiency virus infection) (HCC)    Excellent long term control as of 01/2017 ID f/u (on [place]).  . Tobacco dependence in remission      History reviewed. No pertinent surgical history.     Review of Systems  Constitutional: Negative for chills, fatigue and fever.  Respiratory: Negative for chest tightness, shortness of breath and wheezing.   Cardiovascular: Negative for chest pain.  Skin: Positive for rash.      Objective:    BP (!) 150/95   Pulse 77   Temp 97.8 F (36.6 C) (Oral)   Wt 209 lb (94.8 kg)   BMI 32.73 kg/m  Nursing note and vital signs reviewed.  Physical Exam  Constitutional: He is oriented to  person, place, and time. He appears well-developed. No distress.  Pulmonary/Chest: Effort normal and breath sounds normal. No respiratory distress. He has no wheezes. He has no rales. He exhibits no tenderness.  Neurological: He is alert and oriented to person, place, and time.  Skin: Skin is warm and dry.  Maculopapular rash with purple-red color located in his bilateral groins. There is a small area of skin breakdown in his right groin. There is a small area of redness near his buttocks as well.   Psychiatric: He has a normal mood and affect.       Assessment & Plan:   Problem List Items Addressed This Visit      Musculoskeletal and Integument   Tinea cruris    Symptoms and exam are consistent with Tinea cruis likely with some chaffing superimposed. Will start Lamisil to help with fungal infection. Encouraged changing wet clothes, using cotton underwear and considering anti-chaffing agent to reduce friction. Follow up if symptoms worsen or do not improve.       Relevant Medications   terbinafine (LAMISIL) 250 MG tablet       I am having Kevin Keller start on terbinafine. I am also having him maintain his loratadine and emtricitabine-rilpivir-tenofovir AF.   Meds ordered this encounter  Medications  . terbinafine (LAMISIL) 250 MG  tablet    Sig: Take 1 tablet (250 mg total) by mouth daily.    Dispense:  14 tablet    Refill:  0    Order Specific Question:   Supervising Provider    Answer:   Judyann MunsonSNIDER, CYNTHIA [[id number]]     Follow-up: Return if symptoms worsen or fail to improve.   Kevin EkeGreg Alecsander Hattabaugh, MSN, [facility]

## 2017-11-20 NOTE — Assessment & Plan Note (Addendum)
Symptoms and exam are consistent with Tinea cruis likely with some chaffing superimposed. Will start Lamisil to help with fungal infection. Encouraged changing wet clothes, using cotton underwear and considering anti-chaffing agent to reduce friction. Follow up if symptoms worsen or do not improve.

## 2017-12-03 ENCOUNTER — Other Ambulatory Visit: Payer: Self-pay | Admitting: Behavioral Health

## 2017-12-03 DIAGNOSIS — B20 Human immunodeficiency virus [HIV] disease: Secondary | ICD-10-CM

## 2017-12-03 DIAGNOSIS — B356 Tinea cruris: Secondary | ICD-10-CM

## 2017-12-03 MED ORDER — TERBINAFINE HCL 250 MG PO TABS: 250.0000 mg | ORAL_TABLET | Freq: Every day | ORAL | 0 refills | Status: DC

## 2017-12-03 MED FILL — TERBINAFINE HCL 250 MG TABS: 250 | 14 days supply | Qty: 14 | Fill #0

## 2017-12-03 NOTE — Telephone Encounter (Signed)
Patient called today stating he continues to have "Jock itch" but it is getting better but still present.  Patient states he is down to his last pill and needs a refill if possible or wants to try stronger dose (once a week). He states this was talked about with Tammy SoursGreg NP at his last office visit.  Will call him back to let him know the status.  [facility] vrified [facility]. Angeline SlimAshley Hill RN

## 2017-12-03 NOTE — Telephone Encounter (Signed)
Called Patient to inform him that refill of Lamisil was sent to [facility].  Patient verbalized understanding.   Angeline SlimAshley Juniper Snyders RN

## 2017-12-03 NOTE — Telephone Encounter (Signed)
Yes, okay to refill with previous signature.

## 2017-12-18 MED FILL — ODEFSEY 200-25-25 MG TABS: 200-25-25 | 30 days supply | Qty: 30 | Fill #11

## 2018-01-07 ENCOUNTER — Other Ambulatory Visit: Payer: 59

## 2018-01-07 DIAGNOSIS — B20 Human immunodeficiency virus [HIV] disease: Secondary | ICD-10-CM

## 2018-01-08 LAB — T-HELPER CELL (CD4) - (RCID CLINIC ONLY)
CD4 % Helper T Cell: 26 % — ABNORMAL LOW (ref 33–55)
CD4 T CELL ABS: 420 /uL (ref 400–2700)

## 2018-01-09 LAB — HIV-1 RNA QUANT-NO REFLEX-BLD
HIV 1 RNA QUANT: DETECTED {copies}/mL — AB
HIV-1 RNA Quant, Log: <1.3 Log copies/mL — AB

## 2018-01-10 LAB — LIPID PANEL
CHOLESTEROL: 226 mg/dL — AB (ref <200)
HDL: 41 mg/dL (ref >40)
Non-HDL Cholesterol (Calc): 185 mg/dL (calc) — ABNORMAL HIGH (ref <130)
TRIGLYCERIDES: 403 mg/dL — AB (ref <150)
Total CHOL/HDL Ratio: 5.5 (calc) — ABNORMAL HIGH (ref <5.0)

## 2018-01-10 LAB — COMPREHENSIVE METABOLIC PANEL
AG RATIO: 1.3 (calc) (ref 1.0–2.5)
ALT: 20 U/L (ref 9–46)
AST: 19 U/L (ref 10–40)
Albumin: 4.3 g/dL (ref 3.6–5.1)
Alkaline phosphatase (APISO): 68 U/L (ref 40–115)
BILIRUBIN TOTAL: 0.4 mg/dL (ref 0.2–1.2)
BUN/Creatinine Ratio: 26 (calc) — ABNORMAL HIGH (ref 6–22)
BUN: 31 mg/dL — ABNORMAL HIGH (ref 7–25)
CALCIUM: 9.8 mg/dL (ref 8.6–10.3)
CO2: 28 mmol/L (ref 20–32)
Chloride: 104 mmol/L (ref 98–110)
Creat: 1.17 mg/dL (ref 0.60–1.35)
GLUCOSE: 77 mg/dL (ref 65–99)
Globulin: 3.2 g/dL (calc) (ref 1.9–3.7)
Potassium: 4.6 mmol/L (ref 3.5–5.3)
Sodium: 139 mmol/L (ref 135–146)
Total Protein: 7.5 g/dL (ref 6.1–8.1)

## 2018-01-10 LAB — CBC
HCT: 44.7 % (ref 38.5–50.0)
Hemoglobin: 15.5 g/dL (ref 13.2–17.1)
MCH: 33.5 pg — ABNORMAL HIGH (ref 27.0–33.0)
MCHC: 34.7 g/dL (ref 32.0–36.0)
MCV: 96.8 fL (ref 80.0–100.0)
MPV: 10.2 fL (ref 7.5–12.5)
PLATELETS: 293 10*3/uL (ref 140–400)
RBC: 4.62 10*6/uL (ref 4.20–5.80)
RDW: 11.7 % (ref 11.0–15.0)
WBC: 6 10*3/uL (ref 3.8–10.8)

## 2018-01-10 LAB — RPR: RPR: NONREACTIVE

## 2018-01-16 ENCOUNTER — Encounter: Payer: Self-pay | Admitting: Family

## 2018-01-17 MED FILL — ODEFSEY 200-25-25 MG TABS: 200-25-25 | 30 days supply | Qty: 30 | Fill #0

## 2018-01-21 ENCOUNTER — Ambulatory Visit (INDEPENDENT_AMBULATORY_CARE_PROVIDER_SITE_OTHER): Payer: 59 | Admitting: Internal Medicine

## 2018-01-21 ENCOUNTER — Encounter: Payer: Self-pay | Admitting: Internal Medicine

## 2018-01-21 DIAGNOSIS — F17210 Nicotine dependence, cigarettes, uncomplicated: Secondary | ICD-10-CM | POA: Diagnosis not present

## 2018-01-21 DIAGNOSIS — E785 Hyperlipidemia, unspecified: Secondary | ICD-10-CM | POA: Diagnosis not present

## 2018-01-21 DIAGNOSIS — B20 Human immunodeficiency virus [HIV] disease: Secondary | ICD-10-CM

## 2018-01-21 NOTE — Assessment & Plan Note (Signed)
I reviewed his elevated total cholesterol and triglycerides.  His weight also continues to remain above goal.  I encouraged him to continue with his style modification.  We will help him find a new PCP.

## 2018-01-21 NOTE — Progress Notes (Signed)
Patient Active Problem List   Diagnosis Date Noted  . Human immunodeficiency virus (HIV) disease (HCC) 05/07/2014    Priority: High  . Tinea cruris 11/20/2017  . Obesity 01/17/2017  . Dyslipidemia 01/24/2016  . History of genital warts 07/06/2014  . History of chlamydia infection 07/06/2014  . H/O seasonal allergies 07/05/2014  . Preventative health care 06/18/2014  . Light cigarette smoker (1-9 cigarettes per day) 06/18/2014    Patient's Medications  New Prescriptions   No medications on file  Previous Medications   EMTRICITABINE-RILPIVIR-TENOFOVIR AF (ODEFSEY) 200-25-25 MG TABS TABLET    Take 1 tablet by mouth daily.   LORATADINE (CLARITIN) 10 MG TABLET    Take 10 mg by mouth daily. Reported on 01/24/2016   TERBINAFINE (LAMISIL) 250 MG TABLET    Take 1 tablet (250 mg total) by mouth daily.  Modified Medications   No medications on file  Discontinued Medications   No medications on file    Subjective: Kevin Keller is in for his routine HIV follow-up visit. He never misses a dose of his Odefsey. He and his current partner are moving to a house in [place].  He is making major lifestyle modifications to lose weight.  He no longer smokes cigarettes.  He is very happy. . Review of Systems: Review of Systems  Constitutional: Negative for chills, diaphoresis, fever, malaise/fatigue and weight loss.  HENT: Negative for sore throat.   Respiratory: Negative for cough, sputum production and shortness of breath.   Cardiovascular: Negative for chest pain.  Gastrointestinal: Negative for abdominal pain, diarrhea, heartburn, nausea and vomiting.  Genitourinary: Negative for dysuria and frequency.  Musculoskeletal: Negative for joint pain and myalgias.  Skin: Negative for rash.  Neurological: Negative for dizziness and headaches.  Psychiatric/Behavioral: Negative for depression and substance abuse. The patient is not nervous/anxious.     Past Medical History:  Diagnosis  Date  . H/O seasonal allergies   . HIV (human immunodeficiency virus infection) (HCC)    Excellent long term control as of 01/2017 ID f/u (on [place]).  . Tobacco dependence in remission     Social History   Tobacco Use  . Smoking status: Former Smoker    Packs/day: 0.50    Years: 13.00    Pack years: 6.50    Types: Cigarettes  . Smokeless tobacco: Never Used  Substance Use Topics  . Alcohol use: Yes    Alcohol/week: 0.0 oz    Comment: occasionally  . Drug use: No    Family History  Problem Relation Age of Onset  . Cancer Mother        breast  . Heart disease Father 63  . Cancer Paternal Aunt     No Known Allergies  Health Maintenance  Topic Date Due  . INFLUENZA VACCINE  04/03/2018  . TETANUS/TDAP  08/03/2021  . HIV Screening  Completed    Objective:  Vitals:   01/21/18 0851 01/21/18 0855  BP: (!) 144/85 136/76  Pulse: 76 75  Temp: 98.2 F (36.8 C)   TempSrc: Oral   Weight: 201 lb (91.2 kg)   Height:  (1.702 m)    Body mass index is 31.48 kg/m.  Physical Exam  Constitutional: He is oriented to person, place, and time.  HENT:  Mouth/Throat: No oropharyngeal exudate.  Eyes: Conjunctivae are normal.  Cardiovascular: Normal rate and regular rhythm.  No murmur heard. Pulmonary/Chest: Effort normal and breath sounds normal.  Abdominal: Soft. He exhibits no  mass. There is no tenderness.  Musculoskeletal: Normal range of motion.  Neurological: He is alert and oriented to person, place, and time.  Skin: No rash noted.  Psychiatric: He has a normal mood and affect.    Lab Results Lab Results  Component Value Date   WBC 6.0 01/07/2018   HGB 15.5 01/07/2018   HCT 44.7 01/07/2018   MCV 96.8 01/07/2018   PLT 293 01/07/2018    Lab Results  Component Value Date   CREATININE 1.17 01/07/2018   BUN 31 (H) 01/07/2018   NA 139 01/07/2018   K 4.6 01/07/2018   CL 104 01/07/2018   CO2 28 01/07/2018    Lab Results  Component Value Date   ALT 20  01/07/2018   AST 19 01/07/2018   ALKPHOS 70 01/01/2017   BILITOT 0.4 01/07/2018    Lab Results  Component Value Date   CHOL 226 (H) 01/07/2018   HDL 41 01/07/2018   LDLCALC  01/07/2018     Comment:     . LDL cholesterol not calculated. Triglyceride levels greater than 400 mg/dL invalidate calculated LDL results. . Reference range: <100 . Desirable range <100 mg/dL for primary prevention;   <70 mg/dL for patients with CHD or diabetic patients  with > or = 2 CHD risk factors. Marland Kitchen LDL-C is now calculated using the Martin-Hopkins  calculation, which is a validated novel method providing  better accuracy than the Friedewald equation in the  estimation of LDL-C.  Horald Pollen et al. Lenox Ahr. 1610;960(45): 2061-2068  (http://education.[facility].com/faq/FAQ164)    TRIG 403 (H) 01/07/2018   CHOLHDL 5.5 (H) 01/07/2018   Lab Results  Component Value Date   LABRPR NON-REACTIVE 01/07/2018   HIV 1 RNA Quant (copies/mL)  Date Value  01/07/2018 <20 DETECTED (A)  06/26/2017 <20 DETECTED (A)  01/01/2017 <20 NOT DETECTED   CD4 T Cell Abs (/uL)  Date Value  01/07/2018 420  06/26/2017 660  01/01/2017 500     Problem List Items Addressed This Visit      High   Human immunodeficiency virus (HIV) disease (HCC)    His infection is under excellent, long-term control.  He will continue Odefsey and follow-up after lab work in 1 year.       Relevant Orders   T-helper cell (CD4)- ([facility] clinic only)   HIV 1 RNA quant-no reflex-bld     Unprioritized   Dyslipidemia    I reviewed his elevated total cholesterol and triglycerides.  His weight also continues to remain above goal.  I encouraged him to continue with his style modification.  We will help him find a new PCP.      Light cigarette smoker (1-9 cigarettes per day)    I congratulated him on his ability to quit smoking completely.           [name] Asters, MD [facility] [phone] pager   [phone] cell 01/21/2018, 9:53 AM

## 2018-01-21 NOTE — Assessment & Plan Note (Signed)
His infection is under excellent, long-term control.  He will continue Odefsey and follow-up after lab work in 1 year. 

## 2018-01-21 NOTE — Assessment & Plan Note (Signed)
I congratulated him on his ability to quit smoking completely.

## 2018-01-29 ENCOUNTER — Ambulatory Visit (INDEPENDENT_AMBULATORY_CARE_PROVIDER_SITE_OTHER): Payer: 59 | Admitting: Infectious Diseases

## 2018-01-29 ENCOUNTER — Encounter: Payer: Self-pay | Admitting: Infectious Diseases

## 2018-01-29 DIAGNOSIS — J309 Allergic rhinitis, unspecified: Secondary | ICD-10-CM

## 2018-01-29 NOTE — Patient Instructions (Addendum)
For your symptoms try the following:  1. Rest!!  2. Neti Pot or saline rinse would do this at least once a day.   3. Plenty of fluids   4. Claritin once daily  5. My "Aleve-D" Combo - take 1-2 aleve (generic is fine) and take one 12 hour sudafed twice a day --> this will help with your congestion, body aches and swelling/throat pain.   6. Flonase to both nostrils twice a day to help with swelling/congestion in your nose.    Please continue to watch for other symptoms (cough, fevers/chills, worsening fatigue). We can check an xray.

## 2018-01-29 NOTE — Progress Notes (Signed)
Name: Kevin Keller  DOB: 1980/11/16 MRN: [id number] PCP: Jeoffrey Massed, MD   Patient Active Problem List   Diagnosis Date Noted  . Allergic sinusitis 01/30/2018  . Tinea cruris 11/20/2017  . Obesity 01/17/2017  . Dyslipidemia 01/24/2016  . History of genital warts 07/06/2014  . History of chlamydia infection 07/06/2014  . H/O seasonal allergies 07/05/2014  . Preventative health care 06/18/2014  . Light cigarette smoker (1-9 cigarettes per day) 06/18/2014  . Human immunodeficiency virus (HIV) disease (HCC) 05/07/2014     Subjective:  Kevin Keller  is here today for a sick visit. He is a patient of Dr. Lovina Reach with well controlled HIV maintained on Odefsey. Last CD4 count 420 with undetectable viral load earlier this month.   Feeling poorly today. Very fatigued, body aches, sore throat, nasal congestion, hoarseness that started 24h ago. He felt so poorly he called out of work last night. Ammon tells me he has been moving back and forth between two houses an hour apart and maintaining them in addition to going to school and working. He has known environmental allergies. He mowed a very overgrown lawn without taking allergy medications a few days ago and did not wear a mask as he usually does. Biggest symptom today is sore throat that feels "swollen shut".  He has been taking decongestants, theraflu and [place] over the counter after he saw yesterday that his sputum looked very thick and brown. It has improved today with the above measures. Denies any fevers/chills, cough, dyspnea, rhinorrhea. Did have burning eyes yesterday.   Review of Systems  Constitutional: Positive for malaise/fatigue. Negative for chills, fever and weight loss.  HENT: Positive for congestion and sore throat. Negative for ear pain.   Eyes: Positive for pain (burning ).  Respiratory: Negative for cough, sputum production and shortness of breath.   Cardiovascular: Negative for chest pain.    Gastrointestinal: Negative for abdominal pain and nausea.  Genitourinary: Negative for dysuria.  Musculoskeletal: Positive for myalgias.  Skin: Negative for rash.  Neurological: Negative for dizziness and headaches.    Past Medical History:  Diagnosis Date  . H/O seasonal allergies   . HIV (human immunodeficiency virus infection) (HCC)    Excellent long term control as of 01/2017 ID f/u (on [place]).  . Tobacco dependence in remission     Outpatient Medications Prior to Visit  Medication Sig Dispense Refill  . emtricitabine-rilpivir-tenofovir AF (ODEFSEY) 200-25-25 MG TABS tablet Take 1 tablet by mouth daily. 90 tablet 3  . loratadine (CLARITIN) 10 MG tablet Take 10 mg by mouth daily. Reported on 01/24/2016    . terbinafine (LAMISIL) 250 MG tablet Take 1 tablet (250 mg total) by mouth daily. (Patient not taking: Reported on 01/21/2018) 14 tablet 0   No facility-administered medications prior to visit.      No Known Allergies  Social History   Tobacco Use  . Smoking status: Former Smoker    Packs/day: 0.50    Years: 13.00    Pack years: 6.50    Types: Cigarettes  . Smokeless tobacco: Never Used  Substance Use Topics  . Alcohol use: Yes    Alcohol/week: 0.0 oz    Comment: occasionally  . Drug use: No    Family History  Problem Relation Age of Onset  . Cancer Mother        breast  . Heart disease Father 48  . Cancer Paternal Aunt     Social History   Substance and Sexual Activity  Sexual Activity Never   Comment: given condoms     Objective:   Vitals:   01/29/18 1338  BP: 126/85  Pulse: 80  Temp: 98.3 F (36.8 C)  TempSrc: Oral  Weight: 195 lb 8 oz (88.7 kg)  Height:  (1.702 m)   Body mass index is 30.62 kg/m.  Physical Exam  Constitutional: He is oriented to person, place, and time. He appears well-developed and well-nourished.  HENT:  Nose: No mucosal edema. Right sinus exhibits no maxillary sinus tenderness and no frontal sinus  tenderness. Left sinus exhibits no maxillary sinus tenderness and no frontal sinus tenderness.  Mouth/Throat: Mucous membranes are normal. No uvula swelling. Posterior oropharyngeal erythema present. No oropharyngeal exudate or posterior oropharyngeal edema. Tonsils are 1+ on the right. Tonsils are 1+ on the left. No tonsillar exudate.  Eyes: Pupils are equal, round, and reactive to light. Conjunctivae are normal. No scleral icterus.  Chemosis to b/l eyes   Cardiovascular: Normal rate, regular rhythm and normal heart sounds.  Pulmonary/Chest: Effort normal and breath sounds normal.  Abdominal: Soft. He exhibits no distension. There is no tenderness.  Musculoskeletal: Normal range of motion.  Lymphadenopathy:    He has no cervical adenopathy.  Neurological: He is alert and oriented to person, place, and time.  Skin: Skin is warm and dry. No rash noted.  Vitals reviewed.   Lab Results Lab Results  Component Value Date   WBC 6.0 01/07/2018   HGB 15.5 01/07/2018   HCT 44.7 01/07/2018   MCV 96.8 01/07/2018   PLT 293 01/07/2018    Lab Results  Component Value Date   CREATININE 1.17 01/07/2018   BUN 31 (H) 01/07/2018   NA 139 01/07/2018   K 4.6 01/07/2018   CL 104 01/07/2018   CO2 28 01/07/2018    Lab Results  Component Value Date   ALT 20 01/07/2018   AST 19 01/07/2018   ALKPHOS 70 01/01/2017   BILITOT 0.4 01/07/2018    Lab Results  Component Value Date   CHOL 226 (H) 01/07/2018   HDL 41 01/07/2018   LDLCALC  01/07/2018     Comment:     . LDL cholesterol not calculated. Triglyceride levels greater than 400 mg/dL invalidate calculated LDL results. . Reference range: <100 . Desirable range <100 mg/dL for primary prevention;   <70 mg/dL for patients with CHD or diabetic patients  with > or = 2 CHD risk factors. Marland Kitchen LDL-C is now calculated using the Martin-Hopkins  calculation, which is a validated novel method providing  better accuracy than the Friedewald equation in  the  estimation of LDL-C.  Horald Pollen et al. Lenox Ahr. 4782;956(21): 2061-2068  (http://education.[facility].com/faq/FAQ164)    TRIG 403 (H) 01/07/2018   CHOLHDL 5.5 (H) 01/07/2018   HIV 1 RNA Quant (copies/mL)  Date Value  01/07/2018 <20 DETECTED (A)  06/26/2017 <20 DETECTED (A)  01/01/2017 <20 NOT DETECTED   CD4 T Cell Abs (/uL)  Date Value  01/07/2018 420  06/26/2017 660  01/01/2017 500     Assessment & Plan:   Problem List Items Addressed This Visit      Respiratory   Allergic sinusitis    Symptoms started after exposure to cutting grass without normal preventative measures. Pulmonary exam benign. No signs of bacterial infection. Symptoms most consistent with allergic sinusitis presently. He has had some benefit from OTC decongestants. Recommended Flonase BID and sinus rinses with sterile water to remove allergens. Would also add antihistamine daily.   Body aches,  however are not necessarily fitting with allergic cause - could be from sleep deprivation vs viral component. He is not febrile and has no chills. We discussed symptoms to watch for to call back should he get worse. I recommend he take the next 2 days to rest and continue supportive care. Work note provided.          Rexene Alberts, MSN, NP-C [facility] Pager: [phone] Office: [phone]  01/30/18  9:06 AM

## 2018-01-30 ENCOUNTER — Encounter: Payer: Self-pay | Admitting: Infectious Diseases

## 2018-01-30 DIAGNOSIS — J309 Allergic rhinitis, unspecified: Secondary | ICD-10-CM | POA: Insufficient documentation

## 2018-01-30 NOTE — Assessment & Plan Note (Addendum)
Symptoms started after exposure to cutting grass without normal preventative measures. Pulmonary exam benign. No signs of bacterial infection. Symptoms most consistent with allergic sinusitis presently. He has had some benefit from OTC decongestants. Recommended Flonase BID and sinus rinses with sterile water to remove allergens. Would also add antihistamine daily.   Body aches, however are not necessarily fitting with allergic cause - could be from sleep deprivation vs viral component. He is not febrile and has no chills. We discussed symptoms to watch for to call back should he get worse. I recommend he take the next 2 days to rest and continue supportive care. Work note provided.

## 2018-02-10 ENCOUNTER — Encounter: Payer: Self-pay | Admitting: Family Medicine

## 2018-02-17 ENCOUNTER — Encounter: Payer: Self-pay | Admitting: Internal Medicine

## 2018-02-18 ENCOUNTER — Other Ambulatory Visit: Payer: Self-pay | Admitting: *Deleted

## 2018-02-18 DIAGNOSIS — B20 Human immunodeficiency virus [HIV] disease: Secondary | ICD-10-CM

## 2018-02-18 MED ORDER — EMTRICITAB-RILPIVIR-TENOFOV AF 200-25-25 MG PO TABS: 1.0000 | ORAL_TABLET | Freq: Every day | ORAL | 3 refills | Status: DC

## 2018-02-18 MED FILL — ODEFSEY 200-25-25 MG TABS: 200-25-25 | 30 days supply | Qty: 30 | Fill #1

## 2018-02-28 ENCOUNTER — Encounter: Payer: Self-pay | Admitting: Internal Medicine

## 2018-03-19 MED FILL — ODEFSEY 200-25-25 MG TABS: 200-25-25 | 30 days supply | Qty: 30 | Fill #2

## 2018-04-17 MED FILL — ODEFSEY 200-25-25 MG TABS: 200-25-25 | 30 days supply | Qty: 30 | Fill #3

## 2018-05-19 MED FILL — ODEFSEY 200-25-25 MG TABS: 200-25-25 | 30 days supply | Qty: 30 | Fill #4

## 2018-06-18 MED FILL — ODEFSEY 200-25-25 MG TABS: 200-25-25 | 30 days supply | Qty: 30 | Fill #5

## 2018-06-25 ENCOUNTER — Ambulatory Visit (INDEPENDENT_AMBULATORY_CARE_PROVIDER_SITE_OTHER): Payer: 59 | Admitting: Infectious Diseases

## 2018-06-25 ENCOUNTER — Telehealth: Payer: Self-pay

## 2018-06-25 ENCOUNTER — Encounter: Payer: Self-pay | Admitting: Infectious Diseases

## 2018-06-25 VITALS — BP 146/83 | HR 91 | Temp 98.7°F | Wt 211.0 lb

## 2018-06-25 DIAGNOSIS — B20 Human immunodeficiency virus [HIV] disease: Secondary | ICD-10-CM

## 2018-06-25 DIAGNOSIS — J069 Acute upper respiratory infection, unspecified: Secondary | ICD-10-CM | POA: Insufficient documentation

## 2018-06-25 DIAGNOSIS — F17210 Nicotine dependence, cigarettes, uncomplicated: Secondary | ICD-10-CM

## 2018-06-25 NOTE — Assessment & Plan Note (Signed)
Exam and findings most consistent with acute viral febrile illness with associated pharyngitis. No indications for antibiotics at this time. We discussed symptomatic care and pain relief with OTC pain medications with short-term regular NSAID use. He will call if symptoms worsen or fail to improve after 7-10d. Will give him a work note so he can rest x 2d.

## 2018-06-25 NOTE — Assessment & Plan Note (Signed)
Well controlled HIV. Follow with Dr. Orvan Falconer as expected.

## 2018-06-25 NOTE — Assessment & Plan Note (Signed)
Quit cold [place] since last visit with Dr. Orvan Falconer. Congratulated him!

## 2018-06-25 NOTE — Patient Instructions (Addendum)
General Recommendations:    Please drink plenty of fluids.  Get plenty of rest   Sleep in humidified air  Use saline nasal sprays  Netti pot   OTC Medications:  Decongestants - helps relieve congestion   Flonase (generic fluticasone) or Nasacort (generic triamcinolone) - please make sure to use the "cross-over" technique at a 45 degree angle towards the opposite eye as opposed to straight up the nasal passageway.   Sudafed (generic pseudoephedrine - Note this is the one that is available behind the pharmacy counter); Products with phenylephrine (-PE) may also be used but is often not as effective as pseudoephedrine.   If you have HIGH BLOOD PRESSURE - Coricidin HBP; AVOID any product that is -D as this contains pseudoephedrine which may increase your blood pressure.  Afrin (oxymetazoline) every 6-8 hours for up to 3 days.   Allergies - helps relieve runny nose, itchy eyes and sneezing   Claritin (generic loratidine), Allegra (fexofenidine), or Zyrtec (generic cyrterizine) for runny nose. These medications should not cause drowsiness.  Note - Benadryl (generic diphenhydramine) may be used however may cause drowsiness  Cough -   Delsym or Robitussin (generic dextromethorphan)  Honey  Humidifier in the home  Expectorants - helps loosen mucus to ease removal   Mucinex (generic guaifenesin) as directed on the package.  Headaches / General Aches   Tylenol (generic acetaminophen) - DO NOT EXCEED 3 grams (3,000 mg) in a 24 hour time period  Advil/Motrin (generic ibuprofen)   Sore Throat -   Salt water gargle   Chloraseptic (generic benzocaine) spray or lozenges / Sucrets (generic dyclonine)  Antiinflammatories (motrin/ibuprofen) and tylenol as needed  Honey  Tea   "Magic mouth rinse" - combine 5mL cherry benadryl and 5mL of cherry maalox and gargle for 30 seconds then swallow. Do this 2-3 times a day as needed. Will make you sleepy.

## 2018-06-25 NOTE — Telephone Encounter (Signed)
Patient walked into clinic today stating he does not feel well. Patient is complaining of Fever, body aches, feeling exhausted, feels like his throat swollen, and had occasional head ache. Patient states he has been taking ibuprofen for symptoms. Will have patient come into office today to see Rexene Alberts, Np.  Bp: 152/80 Pulse: 98  Temp: 99.8  Lorenso Courier, CMA

## 2018-06-25 NOTE — Progress Notes (Signed)
Name: Kevin Keller  DOB: [birth year] MRN: [id number] PCP: Patient, No Pcp Per    Patient Active Problem List   Diagnosis Date Noted  . Viral URI 06/25/2018  . Tinea cruris 11/20/2017  . Obesity 01/17/2017  . Dyslipidemia 01/24/2016  . History of genital warts 07/06/2014  . History of chlamydia infection 07/06/2014  . H/O seasonal allergies 07/05/2014  . Preventative health care 06/18/2014  . Human immunodeficiency virus (HIV) disease (HCC) 05/07/2014     Subjective:   Chief Complaint  Patient presents with  . Generalized Body Aches    Patient c/o body aches and sudden chills for 2 days. Confirms feeling warm, sore throat, and hard to swallow.     Hoyle is here to evaluate for acute febrile illness. 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 He tells me his symptoms of malaise, fatigue, body aches and hot/cold chills have started 2 days ago now. Associated symptoms that have also evolved include stuffy/muffled ears and sore throat. He denies any measured fevers, headaches, cough, nasal congestion, n/v/d. Had some sneezing a few days prior to onset but no watery eyes or runny nose.   He has had the flu shot this year. Taking his Odefsey with 100% reported adherence.   Review of Systems  Constitutional: Positive for chills and malaise/fatigue. Negative for diaphoresis, fever and weight loss.  HENT: Positive for sore throat. Negative for congestion.   Eyes: Negative for discharge and redness.  Respiratory: Negative for cough, sputum production, shortness of breath and wheezing.   Cardiovascular: Negative for chest pain and leg swelling.  Gastrointestinal: Negative for abdominal pain, diarrhea and vomiting.  Genitourinary: Negative for dysuria and flank pain.  Musculoskeletal: Positive for joint pain and myalgias. Negative for neck pain.  Skin: Negative for rash.  Neurological: Negative for dizziness, tingling and  headaches.  Psychiatric/Behavioral: Negative for depression and substance abuse. The patient is not nervous/anxious and does not have insomnia.     Past Medical History:  Diagnosis Date  . H/O seasonal allergies   . HIV (human immunodeficiency virus infection) (HCC)    Excellent long term control as of 01/2018 ID f/u (on [place]).  . Tobacco dependence in remission     Outpatient Medications Prior to Visit  Medication Sig Dispense Refill  . emtricitabine-rilpivir-tenofovir AF (ODEFSEY) 200-25-25 MG TABS tablet Take 1 tablet by mouth daily. 90 tablet 3  . loratadine (CLARITIN) 10 MG tablet Take 10 mg by mouth daily. Reported on 01/24/2016    . terbinafine (LAMISIL) 250 MG tablet Take 1 tablet (250 mg total) by mouth daily. (Patient not taking: Reported on 06/25/2018) 14 tablet 0   No facility-administered medications prior to visit.      No Known Allergies  Social History   Tobacco Use  . Smoking status: Former Smoker    Packs/day: 0.50    Years: 13.00    Pack years: 6.50    Types: Cigarettes  . Smokeless tobacco: Never Used  Substance Use Topics  . Alcohol use: Yes    Alcohol/week: 0.0 standard drinks    Comment: occasionally  . Drug use: No    Family History  Problem Relation Age of Onset  . Cancer Mother        breast  . Heart disease Father 40  . Cancer Paternal Aunt     Social History   Substance and Sexual Activity  Sexual Activity Never   Comment:  given condoms     Objective:   Vitals:   06/25/18 1356  BP: (!) 146/83  Pulse: 91  Temp: 98.7 F (37.1 C)  Weight: 211 lb (95.7 kg)   Body mass index is 33.05 kg/m.  Physical Exam  Constitutional: He is oriented to person, place, and time. He appears well-developed and well-nourished. No distress.  Appears ill and fatigued today.   HENT:  Right Ear: External ear normal.  Left Ear: External ear normal.  Nose: No mucosal edema or rhinorrhea. Right sinus exhibits no maxillary sinus tenderness and no  frontal sinus tenderness. Left sinus exhibits no maxillary sinus tenderness and no frontal sinus tenderness.  Mouth/Throat: Mucous membranes are normal. Posterior oropharyngeal erythema present. No oropharyngeal exudate. Posterior oropharyngeal edema: mild. Tonsils are 1+ on the right. Tonsils are 1+ on the left. No tonsillar exudate.  Eyes: Pupils are equal, round, and reactive to light. Conjunctivae are normal. Right eye exhibits no discharge. Left eye exhibits no discharge.  Cardiovascular: Normal rate and regular rhythm.  No murmur heard. Pulmonary/Chest: Effort normal. No respiratory distress. He has no wheezes. He has no rales.  Abdominal: Soft. He exhibits no distension. There is no tenderness.  Musculoskeletal: Normal range of motion.  Lymphadenopathy:    He has cervical adenopathy (posterior ).  Neurological: He is alert and oriented to person, place, and time.  Skin: Skin is warm and dry. Capillary refill takes less than 2 seconds.  Vitals reviewed.   Lab Results Lab Results  Component Value Date   WBC 6.0 01/07/2018   HGB 15.5 01/07/2018   HCT 44.7 01/07/2018   MCV 96.8 01/07/2018   PLT 293 01/07/2018    Lab Results  Component Value Date   CREATININE 1.17 01/07/2018   BUN 31 (H) 01/07/2018   NA 139 01/07/2018   K 4.6 01/07/2018   CL 104 01/07/2018   CO2 28 01/07/2018    Lab Results  Component Value Date   ALT 20 01/07/2018   AST 19 01/07/2018   ALKPHOS 70 01/01/2017   BILITOT 0.4 01/07/2018    Lab Results  Component Value Date   CHOL 226 (H) 01/07/2018   HDL 41 01/07/2018   LDLCALC  01/07/2018     Comment:     . LDL cholesterol not calculated. Triglyceride levels greater than 400 mg/dL invalidate calculated LDL results. . Reference range: <100 . Desirable range <100 mg/dL for primary prevention;   <70 mg/dL for patients with CHD or diabetic patients  with > or = 2 CHD risk factors. Marland Kitchen LDL-C is now calculated using the Martin-Hopkins  calculation,  which is a validated novel method providing  better accuracy than the Friedewald equation in the  estimation of LDL-C.  Horald Pollen et al. Lenox Ahr. 7846;962(95): 2061-2068  (http://education.[facility].com/faq/FAQ164)    TRIG 403 (H) 01/07/2018   CHOLHDL 5.5 (H) 01/07/2018   HIV 1 RNA Quant (copies/mL)  Date Value  01/07/2018 <20 DETECTED (A)  06/26/2017 <20 DETECTED (A)  01/01/2017 <20 NOT DETECTED   CD4 T Cell Abs (/uL)  Date Value  01/07/2018 420  06/26/2017 660  01/01/2017 500     Assessment & Plan:   Problem List Items Addressed This Visit      Unprioritized   Human immunodeficiency virus (HIV) disease (HCC)    Well controlled HIV. Follow with Dr. Orvan Falconer as expected.       RESOLVED: Light cigarette smoker (1-9 cigarettes per day)    Quit cold [place] since last visit with Dr.  Orvan Falconer. Congratulated him!       Viral URI    Exam and findings most consistent with acute viral febrile illness with associated pharyngitis. No indications for antibiotics at this time. We discussed symptomatic care and pain relief with OTC pain medications with short-term regular NSAID use. He will call if symptoms worsen or fail to improve after 7-10d. Will give him a work note so he can rest x 2d.         Rexene Alberts, MSN, NP-C [facility] Pager: [phone] Office: [phone]  06/25/18  3:01 PM

## 2018-07-09 ENCOUNTER — Other Ambulatory Visit: Payer: 59

## 2018-07-09 DIAGNOSIS — B20 Human immunodeficiency virus [HIV] disease: Secondary | ICD-10-CM | POA: Diagnosis not present

## 2018-07-10 ENCOUNTER — Other Ambulatory Visit: Payer: 59

## 2018-07-11 LAB — T-HELPER CELL (CD4) - (RCID CLINIC ONLY)
CD4 T CELL HELPER: 25 % — AB (ref 33–55)
CD4 T Cell Abs: 620 /uL (ref 400–2700)

## 2018-07-11 LAB — HIV-1 RNA QUANT-NO REFLEX-BLD
HIV 1 RNA QUANT: DETECTED {copies}/mL — AB
HIV-1 RNA Quant, Log: <1.3 Log copies/mL — AB

## 2018-07-21 ENCOUNTER — Other Ambulatory Visit: Payer: Self-pay | Admitting: Internal Medicine

## 2018-07-21 DIAGNOSIS — B20 Human immunodeficiency virus [HIV] disease: Secondary | ICD-10-CM

## 2018-07-21 MED FILL — ODEFSEY 200-25-25 MG TABS: 200-25-25 | 30 days supply | Qty: 30 | Fill #0

## 2018-07-24 ENCOUNTER — Encounter: Payer: Self-pay | Admitting: Internal Medicine

## 2018-07-24 ENCOUNTER — Ambulatory Visit (INDEPENDENT_AMBULATORY_CARE_PROVIDER_SITE_OTHER): Payer: 59 | Admitting: Internal Medicine

## 2018-07-24 DIAGNOSIS — E785 Hyperlipidemia, unspecified: Secondary | ICD-10-CM | POA: Diagnosis not present

## 2018-07-24 DIAGNOSIS — E6609 Other obesity due to excess calories: Secondary | ICD-10-CM

## 2018-07-24 DIAGNOSIS — Z6831 Body mass index (BMI) 31.0-31.9, adult: Secondary | ICD-10-CM | POA: Diagnosis not present

## 2018-07-24 DIAGNOSIS — B20 Human immunodeficiency virus [HIV] disease: Secondary | ICD-10-CM

## 2018-07-24 MED ORDER — EMTRICITAB-RILPIVIR-TENOFOV AF 200-25-25 MG PO TABS: 1.0000 | ORAL_TABLET | Freq: Every day | ORAL | 11 refills | Status: DC

## 2018-07-24 NOTE — Assessment & Plan Note (Signed)
His infection is under excellent, long-term control.  He has already received his influenza vaccine.  He wants to follow-up after lab work in 6 months.

## 2018-07-24 NOTE — Assessment & Plan Note (Signed)
His LDL and total cholesterol remain above goal.

## 2018-07-24 NOTE — Progress Notes (Signed)
Patient Active Problem List   Diagnosis Date Noted  . Human immunodeficiency virus (HIV) disease (HCC) 05/07/2014    Priority: High  . Tinea cruris 11/20/2017  . Obesity 01/17/2017  . Dyslipidemia 01/24/2016  . History of genital warts 07/06/2014  . History of chlamydia infection 07/06/2014  . H/O seasonal allergies 07/05/2014  . Preventative health care 06/18/2014    Patient's Medications  New Prescriptions   No medications on file  Previous Medications   LORATADINE (CLARITIN) 10 MG TABLET    Take 10 mg by mouth daily. Reported on 01/24/2016   TERBINAFINE (LAMISIL) 250 MG TABLET    Take 1 tablet (250 mg total) by mouth daily.  Modified Medications   Modified Medication Previous Medication   EMTRICITABINE-RILPIVIR-TENOFOVIR AF (ODEFSEY) 200-25-25 MG TABS TABLET emtricitabine-rilpivir-tenofovir AF (ODEFSEY) 200-25-25 MG TABS tablet      Take 1 tablet by mouth daily.    Take 1 tablet by mouth daily.  Discontinued Medications   ODEFSEY 200-25-25 MG TABS TABLET    TAKE 1 TABLET BY MOUTH DAILY.    Subjective: Kevin Keller is in for his routine HIV follow-up visit.  He has not had any problems obtaining, taking or tolerating his Odefsey.  He denies missing any doses.  He is feeling well.  He slipped up only once since his last visit and had one cigarette when he was out at a nightclub.  He has been very busy with work and school and has not been exercising consistently, therefore he has gained weight.  Review of Systems: Review of Systems  Constitutional: Negative for chills, diaphoresis, fever, malaise/fatigue and weight loss.  HENT: Negative for sore throat.   Respiratory: Negative for cough, sputum production and shortness of breath.   Cardiovascular: Negative for chest pain.  Gastrointestinal: Negative for abdominal pain, diarrhea, heartburn, nausea and vomiting.  Genitourinary: Negative for dysuria and frequency.  Musculoskeletal: Negative for joint pain and myalgias.    Skin: Negative for rash.  Neurological: Negative for dizziness and headaches.  Psychiatric/Behavioral: Negative for depression and substance abuse. The patient is not nervous/anxious.     Past Medical History:  Diagnosis Date  . H/O seasonal allergies   . HIV (human immunodeficiency virus infection) (HCC)    Excellent long term control as of 01/2018 ID f/u (on [place]).  . Tobacco dependence in remission     Social History   Tobacco Use  . Smoking status: Former Smoker    Packs/day: 0.50    Years: 13.00    Pack years: 6.50    Types: Cigarettes  . Smokeless tobacco: Never Used  Substance Use Topics  . Alcohol use: Yes    Alcohol/week: 0.0 standard drinks    Comment: occasionally  . Drug use: No    Family History  Problem Relation Age of Onset  . Cancer Mother        breast  . Heart disease Father 40  . Cancer Paternal Aunt     No Known Allergies  Health Maintenance  Topic Date Due  . INFLUENZA VACCINE  04/03/2018  . TETANUS/TDAP  08/03/2021  . HIV Screening  Completed    Objective:  Vitals:   07/24/18 0907  BP: 131/82  Pulse: 73  Temp: 98 F (36.7 C)  Weight: 214 lb (97.1 kg)   Body mass index is 33.52 kg/m.  Physical Exam  Constitutional: He is oriented to person, place, and time.  His weight is up 9 pounds in the  past 6 months.  He is in good spirits.  HENT:  Mouth/Throat: No oropharyngeal exudate.  Eyes: Conjunctivae are normal.  Cardiovascular: Normal rate, regular rhythm and normal heart sounds.  No murmur heard. Pulmonary/Chest: Effort normal and breath sounds normal.  Abdominal: Soft. He exhibits no mass. There is no tenderness.  Musculoskeletal: Normal range of motion.  Neurological: He is alert and oriented to person, place, and time.  Skin: No rash noted.  Psychiatric: He has a normal mood and affect.    Lab Results Lab Results  Component Value Date   WBC 6.0 01/07/2018   HGB 15.5 01/07/2018   HCT 44.7 01/07/2018   MCV 96.8  01/07/2018   PLT 293 01/07/2018    Lab Results  Component Value Date   CREATININE 1.17 01/07/2018   BUN 31 (H) 01/07/2018   NA 139 01/07/2018   K 4.6 01/07/2018   CL 104 01/07/2018   CO2 28 01/07/2018    Lab Results  Component Value Date   ALT 20 01/07/2018   AST 19 01/07/2018   ALKPHOS 70 01/01/2017   BILITOT 0.4 01/07/2018    Lab Results  Component Value Date   CHOL 226 (H) 01/07/2018   HDL 41 01/07/2018   LDLCALC  01/07/2018     Comment:     . LDL cholesterol not calculated. Triglyceride levels greater than 400 mg/dL invalidate calculated LDL results. . Reference range: <100 . Desirable range <100 mg/dL for primary prevention;   <70 mg/dL for patients with CHD or diabetic patients  with > or = 2 CHD risk factors. Marland Kitchen. LDL-C is now calculated using the Martin-Hopkins  calculation, which is a validated novel method providing  better accuracy than the Friedewald equation in the  estimation of LDL-C.  Horald PollenMartin SS et al. Lenox AhrJAMA. 2130;865(782013;310(19): 2061-2068  (http://education.[facility].com/faq/FAQ164)    TRIG 403 (H) 01/07/2018   CHOLHDL 5.5 (H) 01/07/2018   Lab Results  Component Value Date   LABRPR NON-REACTIVE 01/07/2018   HIV 1 RNA Quant (copies/mL)  Date Value  07/09/2018 <20 DETECTED (A)  01/07/2018 <20 DETECTED (A)  06/26/2017 <20 DETECTED (A)   CD4 T Cell Abs (/uL)  Date Value  07/09/2018 620  01/07/2018 420  06/26/2017 660     Problem List Items Addressed This Visit      High   Human immunodeficiency virus (HIV) disease (HCC)    His infection is under excellent, long-term control.  He has already received his influenza vaccine.  He wants to follow-up after lab work in 6 months.      Relevant Medications   emtricitabine-rilpivir-tenofovir AF (ODEFSEY) 200-25-25 MG TABS tablet   Other Relevant Orders   T-helper cell (CD4)- ([facility] clinic only)   HIV-1 RNA quant-no reflex-bld   CBC   Comprehensive metabolic panel   Lipid panel   RPR      Unprioritized   Obesity    He is [facility] to [facility] [employer].  This would cut down on his travel time and hopefully allow him to get back to regular exercise.      Dyslipidemia    His LDL and total cholesterol remain above goal.           [name] AstersJohn Wilhemina Grall, MD [facility] [phone] pager   [phone] cell 07/24/2018, 9:23 AM

## 2018-07-24 NOTE — Assessment & Plan Note (Signed)
[facility] is [facility] [employer].  This would cut down on his travel time and hopefully allow him to get back to regular exercise.

## 2018-08-08 ENCOUNTER — Encounter: Payer: Self-pay | Admitting: Obstetrics and Gynecology

## 2018-08-08 ENCOUNTER — Ambulatory Visit (INDEPENDENT_AMBULATORY_CARE_PROVIDER_SITE_OTHER): Payer: Self-pay | Admitting: Obstetrics and Gynecology

## 2018-08-08 VITALS — BP 122/90 | HR 79 | Temp 98.1°F | Wt 210.4 lb

## 2018-08-08 DIAGNOSIS — R05 Cough: Secondary | ICD-10-CM

## 2018-08-08 DIAGNOSIS — R6889 Other general symptoms and signs: Secondary | ICD-10-CM

## 2018-08-08 DIAGNOSIS — R059 Cough, unspecified: Secondary | ICD-10-CM

## 2018-08-08 DIAGNOSIS — J0110 Acute frontal sinusitis, unspecified: Secondary | ICD-10-CM

## 2018-08-08 LAB — POCT INFLUENZA A/B
Influenza A, POC: NEGATIVE
Influenza B, POC: NEGATIVE

## 2018-08-08 MED ORDER — BENZONATATE 200 MG PO CAPS: 200.0000 mg | ORAL_CAPSULE | Freq: Two times a day (BID) | ORAL | 0 refills | Status: DC | PRN

## 2018-08-08 MED ORDER — PROMETHAZINE-DM 6.25-15 MG/5ML PO SYRP: 5.0000 mL | ORAL_SOLUTION | Freq: Four times a day (QID) | ORAL | 0 refills | Status: DC | PRN

## 2018-08-08 MED ORDER — AMOXICILLIN-POT CLAVULANATE ER 1000-62.5 MG PO TB12: 2.0000 | ORAL_TABLET | Freq: Two times a day (BID) | ORAL | 0 refills | Status: AC

## 2018-08-08 MED FILL — PROMETHAZINE W/DM SYRUP: 6.25-15 | 6 days supply | Qty: 118 | Fill #0

## 2018-08-08 MED FILL — BENZONATATE 200 MG CAPS: 200 | 10 days supply | Qty: 20 | Fill #0

## 2018-08-08 NOTE — Progress Notes (Signed)
  Subjective:     Patient ID: Kevin Keller, male   DOB: 02-22-1981, 37 y.o.   MRN: [id number]  HPI   [name] is a 37 y.o. male with a history of HIV here with fever; symptoms started on Tuesday. Fever is unknown because he does not own a thermometer. His fever broke after taking night quil and then it returned. He called out of work on Wednesday due to the fever and symptoms. He is a past smoker; no complaints of wheezing. The coughing, facial pain,  and the sneezing is what brought him in today. He has had decreased appetite. He says he feels terrible. Since Tuesday he says he has gotten worse.  Review of Systems  Constitutional: Positive for chills and fever.  HENT: Positive for congestion, sinus pressure, sinus pain and sore throat.   Respiratory: Positive for cough.    Objective:   Physical Exam  Constitutional: He appears well-developed and well-nourished. He has a sickly appearance. He appears ill. No distress.  HENT:  Head: Normocephalic.  Right Ear: Hearing, tympanic membrane, external ear and ear canal normal.  Left Ear: Hearing, tympanic membrane, external ear and ear canal normal.  Nose: Right sinus exhibits maxillary sinus tenderness and frontal sinus tenderness. Left sinus exhibits maxillary sinus tenderness and frontal sinus tenderness.  Mouth/Throat: Uvula is midline. Mucous membranes are not pale, not dry and not cyanotic. Posterior oropharyngeal erythema present. No posterior oropharyngeal edema.  Eyes: Pupils are equal, round, and reactive to light.  Pulmonary/Chest: Effort normal and breath sounds normal.   Blood pressure 122/90, pulse 95, temperature 98.1 F (36.7 C), weight 210 lb 6.4 oz (95.4 kg), SpO2 97 %.  Assessment:   1. Acute non-recurrent frontal sinusitis   2. Flu-like symptoms   3. Cough    Plan:   -Flu today negative  [age] y/o male with active medical history of HIV here with a 4 days of fever, chills, sneezing, facial pressure. He appears  ill, frequent coughing in office today. D/t immunocompromised condition will treat with antibiotics - Augmentin 2g BID X 10 days, Tessalon Pearles, Phenergan cough - Note provided for work - rest, hydration - Return if symptoms are not improving   Venia Carbonasch, Kevin Keller I, NP 08/08/2018 3:26 PM

## 2018-08-13 ENCOUNTER — Telehealth: Payer: Self-pay

## 2018-08-13 NOTE — Telephone Encounter (Signed)
Patient walked into clinic today requesting copies of immunization record for school. Patient is returning to school for phlebotomy. Printed copy of immunization for patient to take to school. Will hand copy of paper work to Dr. Orvan Falconerampbell to review and fill out any sections that relate to our office care. Will send mychart message once form is complete. Kevin CourierJose L Maldonado, [place]

## 2018-08-14 ENCOUNTER — Ambulatory Visit (INDEPENDENT_AMBULATORY_CARE_PROVIDER_SITE_OTHER): Payer: Self-pay | Admitting: Physician Assistant

## 2018-08-14 ENCOUNTER — Encounter: Payer: Self-pay | Admitting: Physician Assistant

## 2018-08-14 VITALS — BP 130/82 | HR 90 | Temp 98.2°F | Ht 66.0 in | Wt 211.4 lb

## 2018-08-14 DIAGNOSIS — Z020 Encounter for examination for admission to educational institution: Secondary | ICD-10-CM

## 2018-08-14 NOTE — Patient Instructions (Signed)

## 2018-08-14 NOTE — Progress Notes (Signed)
Subjective:  Kevin Keller is a 37 y.o. male who presents for basic physical exam.Needs school form completed. 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  Patient denies any current health related concerns.   Chronic medical conditions: HIV, seasonal allergies, former smoker.   Followed closely by Infectious Disease, Dr. Orvan Falconer. He had his immunization record completed by Dr. Orvan Falconer yesterday.   Does not need any blood work today. Last blood work collected by ID on 07/09/18.   No other questions or concerns today.    Immunization History  Administered Date(s) Administered  . Hepatitis A 08/04/2011  . Hepatitis A, Adult 06/18/2014  . Influenza,inj,Quad PF,6+ Mos 07/26/2015, 05/26/2018  . Influenza-Unspecified 04/26/2014, 05/27/2017  . Pneumococcal Conjugate-13 03/03/2014  . Pneumococcal Polysaccharide-23 08/04/2011  . Tdap 08/04/2011    Past Medical History:  Diagnosis Date  . H/O seasonal allergies   . HIV (human immunodeficiency virus infection) (HCC)    Excellent long term control as of 01/2018 ID f/u (on [place]).  . Tobacco dependence in remission     No past surgical history on file.  Social History   Tobacco Use  . Smoking status: Former Smoker    Packs/day: 0.50    Years: 13.00    Pack years: 6.50    Types: Cigarettes  . Smokeless tobacco: Never Used  Substance Use Topics  . Alcohol use: Yes    Alcohol/week: 0.0 standard drinks    Comment: occasionally  . Drug use: No    No Known Allergies  Current Outpatient Medications  Medication Sig Dispense Refill  . amoxicillin-clavulanate (AUGMENTIN XR) 1000-62.5 MG 12 hr tablet Take 2 tablets by mouth 2 (two) times daily for 10 days. (Patient not taking: Reported on 08/14/2018) 40 tablet 0  . benzonatate (TESSALON) 200 MG capsule Take 1 capsule (200 mg total) by mouth 2 (two) times daily as needed for cough. (Patient not taking: Reported on 08/14/2018) 20 capsule 0  .  emtricitabine-rilpivir-tenofovir AF (ODEFSEY) 200-25-25 MG TABS tablet Take 1 tablet by mouth daily. (Patient not taking: Reported on 08/14/2018) 30 tablet 11  . loratadine (CLARITIN) 10 MG tablet Take 10 mg by mouth daily. Reported on 01/24/2016    . promethazine-dextromethorphan (PROMETHAZINE-DM) 6.25-15 MG/5ML syrup Take 5 mLs by mouth 4 (four) times daily as needed for cough. (Patient not taking: Reported on 08/14/2018) 118 mL 0  . terbinafine (LAMISIL) 250 MG tablet Take 1 tablet (250 mg total) by mouth daily. (Patient not taking: Reported on 08/14/2018) 14 tablet 0   No current facility-administered medications for this visit.    Review of Systems  Constitutional: Negative for chills, diaphoresis, fever, malaise/fatigue and weight loss.  HENT: Negative for congestion, ear discharge, ear pain, hearing loss, nosebleeds, sinus pain, sore throat and tinnitus.   Eyes: Negative for blurred vision, double vision, photophobia, pain, discharge and redness.  Respiratory: Negative for cough, hemoptysis, sputum production, shortness of breath, wheezing and stridor.   Cardiovascular: Negative for chest pain, palpitations, orthopnea, claudication, leg swelling and PND.  Gastrointestinal: Negative for abdominal pain, blood in stool, constipation, diarrhea, heartburn, melena, nausea and vomiting.  Genitourinary: Negative for dysuria, flank pain, frequency, hematuria and urgency.  Musculoskeletal: Negative for back pain, falls, joint pain, myalgias and neck pain.  Skin: Negative for itching and rash.  Neurological: Negative for dizziness, tingling, tremors, sensory change, speech change, focal weakness, seizures, loss of consciousness, weakness and headaches.  Endo/Heme/Allergies: Negative for environmental allergies and polydipsia. Does not bruise/bleed easily.  Psychiatric/Behavioral: Negative for  depression, hallucinations, memory loss, substance abuse and suicidal ideas. The patient is not nervous/anxious  and does not have insomnia.    Objective:  BP 130/82   Pulse 90   Temp 98.2 F (36.8 C) (Oral)   Ht 5\' 6"  (1.676 m)   Wt 211 lb 6.4 oz (95.9 kg)   SpO2 98%   BMI 34.12 kg/m   Physical Exam  Constitutional: He is oriented to person, place, and time and well-developed, well-nourished, and in no distress.  HENT:  Head: Normocephalic and atraumatic.  Right Ear: Hearing, tympanic membrane, external ear and ear canal normal.  Left Ear: Hearing, tympanic membrane, external ear and ear canal normal.  Nose: Nose normal.  Mouth/Throat: Uvula is midline, oropharynx is clear and moist and mucous membranes are normal. No oropharyngeal exudate.  Eyes: Pupils are equal, round, and reactive to light. Conjunctivae and EOM are normal.  Neck: Trachea normal and normal range of motion.  Cardiovascular: Normal rate, regular rhythm, normal heart sounds and intact distal pulses.  Pulmonary/Chest: Effort normal and breath sounds normal.  Abdominal: Soft. Normal appearance and bowel sounds are normal. There is no abdominal tenderness. A hernia is present. Hernia confirmed positive in the umbilical area (small reducible hernia noted).  Genitourinary:    Genitourinary Comments: Deferred   Musculoskeletal: Normal range of motion.  Lymphadenopathy:       Head (right side): No submental, no submandibular, no tonsillar, no preauricular, no posterior auricular and no occipital adenopathy present.       Head (left side): No submental, no submandibular, no tonsillar, no preauricular, no posterior auricular and no occipital adenopathy present.    He has no cervical adenopathy.       Right: No supraclavicular adenopathy present.       Left: No supraclavicular adenopathy present.  Neurological: He is alert and oriented to person, place, and time. He has normal sensation, normal strength and normal reflexes. Gait normal.  Skin: Skin is warm and dry.  Psychiatric: Affect normal.  Vitals reviewed.   Assessment:   basic physical exam   Plan:  Patient education provided.  No labs needed at this time. Patient will follow up with PCP.  Benjiman CoreBrittany Aedyn Mckeon, Cordelia Poche-C  [facility] 08/14/2018 2:28 PM

## 2018-08-19 MED FILL — ODEFSEY 200-25-25 MG TABS: 200-25-25 | 30 days supply | Qty: 30 | Fill #1

## 2018-09-09 ENCOUNTER — Encounter: Payer: Self-pay | Admitting: Family Medicine

## 2018-09-18 MED FILL — ODEFSEY 200-25-25 MG TABS: 200-25-25 | 30 days supply | Qty: 30 | Fill #2

## 2018-10-20 MED FILL — ODEFSEY 200-25-25 MG TABS: 200-25-25 | 30 days supply | Qty: 30 | Fill #3

## 2018-11-10 ENCOUNTER — Telehealth: Payer: Self-pay

## 2018-11-10 ENCOUNTER — Encounter: Payer: Self-pay | Admitting: Internal Medicine

## 2018-11-10 ENCOUNTER — Ambulatory Visit (INDEPENDENT_AMBULATORY_CARE_PROVIDER_SITE_OTHER): Payer: 59 | Admitting: Internal Medicine

## 2018-11-10 DIAGNOSIS — K429 Umbilical hernia without obstruction or gangrene: Secondary | ICD-10-CM | POA: Insufficient documentation

## 2018-11-10 DIAGNOSIS — N50811 Right testicular pain: Secondary | ICD-10-CM | POA: Insufficient documentation

## 2018-11-10 DIAGNOSIS — B20 Human immunodeficiency virus [HIV] disease: Secondary | ICD-10-CM

## 2018-11-10 NOTE — Assessment & Plan Note (Signed)
I have ordered a scrotal ultrasound. 

## 2018-11-10 NOTE — Telephone Encounter (Signed)
Kevin Keller walked in the office today with hernia pain and discomfort. He described having his bellybutton protrude when normally this is inverted. He also explained that one of his testicles are double the size of the other thus have made it uncomfortable to have intercourse.   Patient states that he discussed hernia at prior visits with Dr. Orvan Falconer and was told to come back if the hernia started bothering him.  Patient states he wishes to see Dr. Orvan Falconer to discuss treatment.  Kevin Keller is scheduled to come back this afternoon 11/10/2018 at 3:45 pm to see Dr. Orvan Falconer.    Gerarda Fraction, [place]

## 2018-11-10 NOTE — Progress Notes (Signed)
Patient Active Problem List   Diagnosis Date Noted  . Human immunodeficiency virus (HIV) disease (HCC) 05/07/2014    Priority: High  . Testicular pain, right 11/10/2018  . Umbilical hernia 11/10/2018  . Tinea cruris 11/20/2017  . Obesity 01/17/2017  . Dyslipidemia 01/24/2016  . History of genital warts 07/06/2014  . History of chlamydia infection 07/06/2014  . H/O seasonal allergies 07/05/2014  . Preventative health care 06/18/2014    Patient's Medications  New Prescriptions   No medications on file  Previous Medications   BENZONATATE (TESSALON) 200 MG CAPSULE    Take 1 capsule (200 mg total) by mouth 2 (two) times daily as needed for cough.   EMTRICITABINE-RILPIVIR-TENOFOVIR AF (ODEFSEY) 200-25-25 MG TABS TABLET    Take 1 tablet by mouth daily.   LORATADINE (CLARITIN) 10 MG TABLET    Take 10 mg by mouth daily. Reported on 01/24/2016   PROMETHAZINE-DEXTROMETHORPHAN (PROMETHAZINE-DM) 6.25-15 MG/5ML SYRUP    Take 5 mLs by mouth 4 (four) times daily as needed for cough.   TERBINAFINE (LAMISIL) 250 MG TABLET    Take 1 tablet (250 mg total) by mouth daily.  Modified Medications   No medications on file  Discontinued Medications   No medications on file    Subjective: Kevin Keller is seen on a working basis.  He has had no problems obtaining, taking or tolerating his Odefsey and never misses doses.  He wanted to be seen today because he is concerned that his umbilical hernia is getting larger.  He is also noted some gradual enlargement in his right testicle associated with discomfort.  He thinks his right testicle is about twice as large as his left. He uses a "cock ring" when he is sexually active with his long-term male partner.  He says that yesterday he could not put it on because his testicle had gotten so large.  They had to stop having sex because of his discomfort.  Review of Systems: Review of Systems  Constitutional: Negative for chills, diaphoresis and fever.    Gastrointestinal: Negative for abdominal pain, constipation, diarrhea, nausea and vomiting.  Genitourinary: Negative for dysuria, frequency and urgency.    Past Medical History:  Diagnosis Date  . H/O seasonal allergies   . HIV (human immunodeficiency virus infection) (HCC)    Excellent long term control as of 07/2018 ID f/u (on [place]).  . Tobacco dependence in remission     Social History   Tobacco Use  . Smoking status: Former Smoker    Packs/day: 0.50    Years: 13.00    Pack years: 6.50    Types: Cigarettes  . Smokeless tobacco: Never Used  Substance Use Topics  . Alcohol use: Yes    Alcohol/week: 0.0 standard drinks    Comment: occasionally  . Drug use: No    Family History  Problem Relation Age of Onset  . Cancer Mother        breast  . Heart disease Father 37  . Cancer Paternal Aunt     No Known Allergies  Health Maintenance  Topic Date Due  . TETANUS/TDAP  08/03/2021  . INFLUENZA VACCINE  Completed  . HIV Screening  Completed    Objective:  Vitals:   11/10/18 1544  BP: (!) 150/82  Pulse: 84  Temp: 98.3 F (36.8 C)  Weight: 213 lb (96.6 kg)   Body mass index is 34.38 kg/m.  Physical Exam Constitutional:      Comments: He  is anxious and talkative.  HENT:     Mouth/Throat:     Pharynx: No oropharyngeal exudate.  Eyes:     Conjunctiva/sclera: Conjunctivae normal.  Cardiovascular:     Rate and Rhythm: Normal rate and regular rhythm.     Heart sounds: No murmur.  Pulmonary:     Effort: Pulmonary effort is normal.     Breath sounds: Normal breath sounds.  Abdominal:     Palpations: Abdomen is soft.     Tenderness: There is no abdominal tenderness.     Hernia: A hernia is present.     Comments: He has a dime sized umbilical hernia.  It is nontender to palpation.  It is easily compressible.  Genitourinary:    Comments: I do not notice any difference in size of his testicles.  His right testicle is not tender to palpation Skin:     Findings: No rash.  Psychiatric:        Mood and Affect: Mood normal.     Lab Results Lab Results  Component Value Date   WBC 6.0 01/07/2018   HGB 15.5 01/07/2018   HCT 44.7 01/07/2018   MCV 96.8 01/07/2018   PLT 293 01/07/2018    Lab Results  Component Value Date   CREATININE 1.17 01/07/2018   BUN 31 (H) 01/07/2018   NA 139 01/07/2018   K 4.6 01/07/2018   CL 104 01/07/2018   CO2 28 01/07/2018    Lab Results  Component Value Date   ALT 20 01/07/2018   AST 19 01/07/2018   ALKPHOS 70 01/01/2017   BILITOT 0.4 01/07/2018    Lab Results  Component Value Date   CHOL 226 (H) 01/07/2018   HDL 41 01/07/2018   LDLCALC  01/07/2018     Comment:     . LDL cholesterol not calculated. Triglyceride levels greater than 400 mg/dL invalidate calculated LDL results. . Reference range: <100 . Desirable range <100 mg/dL for primary prevention;   <70 mg/dL for patients with CHD or diabetic patients  with > or = 2 CHD risk factors. Marland Kitchen LDL-C is now calculated using the Martin-Hopkins  calculation, which is a validated novel method providing  better accuracy than the Friedewald equation in the  estimation of LDL-C.  Horald Pollen et al. Lenox Ahr. 7353;299(24): 2061-2068  (http://education.[facility].com/faq/FAQ164)    TRIG 403 (H) 01/07/2018   CHOLHDL 5.5 (H) 01/07/2018   Lab Results  Component Value Date   LABRPR NON-REACTIVE 01/07/2018   HIV 1 RNA Quant (copies/mL)  Date Value  07/09/2018 <20 DETECTED (A)  01/07/2018 <20 DETECTED (A)  06/26/2017 <20 DETECTED (A)   CD4 T Cell Abs (/uL)  Date Value  07/09/2018 620  01/07/2018 420  06/26/2017 660     Problem List Items Addressed This Visit      Unprioritized   Umbilical hernia   Testicular pain, right   Relevant Orders   US SCROTUM        [name] Asters, MD [facility] for Infectious Disease [facility] [phone] pager   [phone] cell 11/10/2018, 4:04 PM

## 2018-11-10 NOTE — Assessment & Plan Note (Signed)
His small umbilical hernia is not causing any complications.  I will help him get a new PCP.

## 2018-11-10 NOTE — Assessment & Plan Note (Signed)
His adherence remains perfect.  He will continue [facility] and follow-up after scheduled blood work in May.

## 2018-11-14 ENCOUNTER — Ambulatory Visit
Admission: RE | Admit: 2018-11-14 | Discharge: 2018-11-14 | Disposition: A | Discharge status: Home / Self Care | Payer: 59 | Source: Ambulatory Visit | Attending: Internal Medicine | Admitting: Internal Medicine

## 2018-11-14 DIAGNOSIS — N433 Hydrocele, unspecified: Secondary | ICD-10-CM | POA: Diagnosis not present

## 2018-11-14 DIAGNOSIS — N50811 Right testicular pain: Secondary | ICD-10-CM

## 2018-11-18 MED FILL — ODEFSEY 200-25-25 MG TABS: 200-25-25 | 30 days supply | Qty: 30 | Fill #4 | Status: TO

## 2018-12-09 MED FILL — ODEFSEY 200-25-25 MG TABS: 200-25-25 | 30 days supply | Qty: 30 | Fill #0

## 2019-01-07 MED FILL — ODEFSEY 200-25-25 MG TABS: 200-25-25 | 30 days supply | Qty: 30 | Fill #1

## 2019-01-22 ENCOUNTER — Other Ambulatory Visit: Payer: 59

## 2019-01-22 ENCOUNTER — Other Ambulatory Visit: Payer: Self-pay

## 2019-01-22 DIAGNOSIS — B20 Human immunodeficiency virus [HIV] disease: Secondary | ICD-10-CM | POA: Diagnosis not present

## 2019-01-23 LAB — T-HELPER CELL (CD4) - (RCID CLINIC ONLY)
CD4 % Helper T Cell: 28 % — ABNORMAL LOW (ref 33–65)
CD4 T Cell Abs: 485 /uL (ref 400–1790)

## 2019-01-27 LAB — CBC
HCT: 45.5 % (ref 38.5–50.0)
Hemoglobin: 15.7 g/dL (ref 13.2–17.1)
MCH: 32.2 pg (ref 27.0–33.0)
MCHC: 34.5 g/dL (ref 32.0–36.0)
MCV: 93.4 fL (ref 80.0–100.0)
MPV: 10 fL (ref 7.5–12.5)
Platelets: 300 10*3/uL (ref 140–400)
RBC: 4.87 10*6/uL (ref 4.20–5.80)
RDW: 12 % (ref 11.0–15.0)
WBC: 5.1 10*3/uL (ref 3.8–10.8)

## 2019-01-27 LAB — COMPREHENSIVE METABOLIC PANEL
AG Ratio: 1.5 (calc) (ref 1.0–2.5)
ALT: 54 U/L — ABNORMAL HIGH (ref 9–46)
AST: 32 U/L (ref 10–40)
Albumin: 4.3 g/dL (ref 3.6–5.1)
Alkaline phosphatase (APISO): 55 U/L (ref 36–130)
BUN: 18 mg/dL (ref 7–25)
CO2: 29 mmol/L (ref 20–32)
Calcium: 9.9 mg/dL (ref 8.6–10.3)
Chloride: 99 mmol/L (ref 98–110)
Creat: 1.07 mg/dL (ref 0.60–1.35)
Globulin: 2.9 g/dL (calc) (ref 1.9–3.7)
Glucose, Bld: 95 mg/dL (ref 65–99)
Potassium: 4 mmol/L (ref 3.5–5.3)
Sodium: 138 mmol/L (ref 135–146)
Total Bilirubin: 0.5 mg/dL (ref 0.2–1.2)
Total Protein: 7.2 g/dL (ref 6.1–8.1)

## 2019-01-27 LAB — LIPID PANEL
Cholesterol: 197 mg/dL (ref <200)
HDL: 41 mg/dL (ref >=40)
LDL Cholesterol (Calc): 119 mg/dL (calc) — ABNORMAL HIGH
Non-HDL Cholesterol (Calc): 156 mg/dL (calc) — ABNORMAL HIGH (ref <130)
Total CHOL/HDL Ratio: 4.8 (calc) (ref <5.0)
Triglycerides: 243 mg/dL — ABNORMAL HIGH (ref <150)

## 2019-01-27 LAB — RPR: RPR Ser Ql: NONREACTIVE

## 2019-01-27 LAB — HIV-1 RNA QUANT-NO REFLEX-BLD
HIV 1 RNA Quant: <20 copies/mL
HIV-1 RNA Quant, Log: <1.3 Log copies/mL

## 2019-02-05 ENCOUNTER — Telehealth: Payer: Self-pay | Admitting: Internal Medicine

## 2019-02-05 NOTE — Telephone Encounter (Signed)
COVID-19 Pre-Screening Questions: ° °Do you currently have a fever (>100 °F), chills or unexplained body aches? No   ° °Are you currently experiencing new cough, shortness of breath, sore throat, runny nose? No   °•  °Have you recently travelled outside the state of [place] in the last 14 days? no °•  °1. Have you been in contact with someone that is currently pending confirmation of Covid19 testing or has been confirmed to have the Covid19 virus?  No  ° °

## 2019-02-09 ENCOUNTER — Encounter: Payer: Self-pay | Admitting: Internal Medicine

## 2019-02-09 ENCOUNTER — Other Ambulatory Visit: Payer: Self-pay

## 2019-02-09 ENCOUNTER — Ambulatory Visit (INDEPENDENT_AMBULATORY_CARE_PROVIDER_SITE_OTHER): Payer: 59 | Admitting: Internal Medicine

## 2019-02-09 VITALS — BP 150/89 | HR 85 | Temp 98.2°F | Wt 218.0 lb

## 2019-02-09 DIAGNOSIS — R1013 Epigastric pain: Secondary | ICD-10-CM | POA: Diagnosis not present

## 2019-02-09 DIAGNOSIS — Z23 Encounter for immunization: Secondary | ICD-10-CM | POA: Diagnosis not present

## 2019-02-09 DIAGNOSIS — B20 Human immunodeficiency virus [HIV] disease: Secondary | ICD-10-CM

## 2019-02-09 NOTE — Progress Notes (Signed)
Patient Active Problem List   Diagnosis Date Noted  . Human immunodeficiency virus (HIV) disease ([place]) 05/07/2014    Priority: High  . Epigastric pain 02/09/2019    Priority: Medium  . Testicular pain, right 11/10/2018    Priority: Medium  . Obesity 01/17/2017    Priority: Medium  . Umbilical hernia 03/02/1600  . Tinea cruris 11/20/2017  . Dyslipidemia 01/24/2016  . History of genital warts 07/06/2014  . History of chlamydia infection 07/06/2014  . H/O seasonal allergies 07/05/2014  . Preventative health care 06/18/2014    Patient's Medications  New Prescriptions   No medications on file  Previous Medications   BENZONATATE (TESSALON) 200 MG CAPSULE    Take 1 capsule (200 mg total) by mouth 2 (two) times daily as needed for cough.   EMTRICITABINE-RILPIVIR-TENOFOVIR AF (ODEFSEY) 200-25-25 MG TABS TABLET    Take 1 tablet by mouth daily.   LORATADINE (CLARITIN) 10 MG TABLET    Take 10 mg by mouth daily. Reported on 01/24/2016   PROMETHAZINE-DEXTROMETHORPHAN (PROMETHAZINE-DM) 6.25-15 MG/5ML SYRUP    Take 5 mLs by mouth 4 (four) times daily as needed for cough.   TERBINAFINE (LAMISIL) 250 MG TABLET    Take 1 tablet (250 mg total) by mouth daily.  Modified Medications   No medications on file  Discontinued Medications   No medications on file    Subjective: Deloris is in for his routine HIV follow-up visit.  He has had no problems obtaining, taking or tolerating his Odefsey and has not missed any doses.  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  He has been having some epigastric pain recently.  He tried taking Tums without relief.  He has been considering taking over-the-counter PPIs.  He does notice some frequent burping.  He says that he does not have time to exercise and that is why he is gained more weight.  Review of Systems: Review of Systems  Constitutional:  Negative for chills, diaphoresis, fever, malaise/fatigue and weight loss.  HENT: Negative for sore throat.   Respiratory: Negative for cough, sputum production and shortness of breath.   Cardiovascular: Negative for chest pain.  Gastrointestinal: Positive for abdominal pain and heartburn. Negative for diarrhea, nausea and vomiting.  Genitourinary: Negative for dysuria and frequency.  Musculoskeletal: Negative for joint pain and myalgias.  Skin: Negative for rash.  Neurological: Negative for dizziness and headaches.  Psychiatric/Behavioral: Negative for depression and substance abuse. The patient is not nervous/anxious.     Past Medical History:  Diagnosis Date  . H/O seasonal allergies   . HIV (human immunodeficiency virus infection) ([place])    Excellent long term control as of 07/2018 ID f/u (on [place]).  . Tobacco dependence in remission     Social History   Tobacco Use  . Smoking status: Former Smoker    Packs/day: 0.50    Years: 13.00    Pack years: 6.50    Types: Cigarettes  . Smokeless tobacco: Never Used  Substance Use Topics  . Alcohol use: Yes    Alcohol/week: 0.0 standard drinks    Comment: occasionally  . Drug use: No    Family History  Problem Relation Age of Onset  . Cancer Mother        breast  . Heart disease Father 67  . Cancer Paternal Aunt  No Known Allergies  Health Maintenance  Topic Date Due  . INFLUENZA VACCINE  04/04/2019  . TETANUS/TDAP  08/03/2021  . HIV Screening  Completed    Objective:  Vitals:   02/09/19 0856  BP: (!) 150/89  Pulse: 85  Temp: 98.2 F (36.8 C)  TempSrc: Oral  SpO2: 95%  Weight: 218 lb (98.9 kg)   Body mass index is 35.19 kg/m.  Physical Exam Constitutional:      Comments: He is talkative and in good spirits as usual.  His weight is up 5 pounds.  HENT:     Mouth/Throat:     Pharynx: No oropharyngeal exudate.  Eyes:     Conjunctiva/sclera: Conjunctivae normal.  Cardiovascular:     Rate and  Rhythm: Normal rate and regular rhythm.     Heart sounds: No murmur.  Pulmonary:     Effort: Pulmonary effort is normal.     Breath sounds: Normal breath sounds.  Abdominal:     Palpations: Abdomen is soft. There is no mass.     Tenderness: There is no abdominal tenderness.     Comments: No change in his quarter sized umbilical hernia.  Musculoskeletal: Normal range of motion.  Skin:    Findings: No rash.  Neurological:     Mental Status: He is alert and oriented to person, place, and time.  Psychiatric:        Mood and Affect: Mood normal.     Lab Results Lab Results  Component Value Date   WBC 5.1 01/22/2019   HGB 15.7 01/22/2019   HCT 45.5 01/22/2019   MCV 93.4 01/22/2019   PLT 300 01/22/2019    Lab Results  Component Value Date   CREATININE 1.07 01/22/2019   BUN 18 01/22/2019   NA 138 01/22/2019   K 4.0 01/22/2019   CL 99 01/22/2019   CO2 29 01/22/2019    Lab Results  Component Value Date   ALT 54 (H) 01/22/2019   AST 32 01/22/2019   ALKPHOS 70 01/01/2017   BILITOT 0.5 01/22/2019    Lab Results  Component Value Date   CHOL 197 01/22/2019   HDL 41 01/22/2019   LDLCALC 119 (H) 01/22/2019   TRIG 243 (H) 01/22/2019   CHOLHDL 4.8 01/22/2019   Lab Results  Component Value Date   LABRPR NON-REACTIVE 01/22/2019   HIV 1 RNA Quant (copies/mL)  Date Value  01/22/2019 <20 NOT DETECTED  07/09/2018 <20 DETECTED (A)  01/07/2018 <20 DETECTED (A)   CD4 T Cell Abs (/uL)  Date Value  01/22/2019 485  07/09/2018 620  01/07/2018 420     Problem List Items Addressed This Visit      High   Human immunodeficiency virus (HIV) disease (HCC)    His infection remains under excellent, long-term control.  He will continue Odefsey and follow-up after lab work in 6 months.      Relevant Orders   T-helper cell (CD4)- ([facility] clinic only)   HIV-1 RNA quant-no reflex-bld   CBC   Comprehensive metabolic panel   Lipid panel   RPR     Medium   Obesity    Even though  he has a busy work schedule I encouraged him to try to fit routine exercise into his daily schedule.      Epigastric pain    I suggested a trial of over-the-counter Prilosec taken first thing each morning.  We will continue to work on getting him a PCP.  [name] AstersJohn Sherald Balbuena, MD [facility] [phone] pager   [phone] cell 02/09/2019, 9:21 AM

## 2019-02-09 NOTE — Assessment & Plan Note (Signed)
His infection remains under excellent, long-term control.  He will continue Odefsey and follow-up after lab work in 6 months. 

## 2019-02-09 NOTE — Assessment & Plan Note (Signed)
Even though he has a busy work schedule I encouraged him to try to fit routine exercise into his daily schedule.

## 2019-02-09 NOTE — Assessment & Plan Note (Signed)
I suggested a trial of over-the-counter Prilosec taken first thing each morning.  We will continue to work on getting him a PCP.

## 2019-02-13 ENCOUNTER — Encounter: Payer: Self-pay | Admitting: *Deleted

## 2019-02-13 MED FILL — ODEFSEY 200-25-25 MG TABS: 200-25-25 | 30 days supply | Qty: 30 | Fill #0

## 2019-02-13 NOTE — Addendum Note (Signed)
Addended by: Landis Gandy on: 02/13/2019 05:14 PM   Modules accepted: Orders

## 2019-03-10 MED FILL — ODEFSEY 200-25-25 MG TABS: 200-25-25 | 30 days supply | Qty: 30 | Fill #1

## 2019-04-10 MED FILL — ODEFSEY 200-25-25 MG TABS: 200-25-25 | 30 days supply | Qty: 30 | Fill #2

## 2019-04-13 ENCOUNTER — Ambulatory Visit (INDEPENDENT_AMBULATORY_CARE_PROVIDER_SITE_OTHER): Payer: 59 | Admitting: *Deleted

## 2019-04-13 ENCOUNTER — Other Ambulatory Visit: Payer: Self-pay

## 2019-04-13 DIAGNOSIS — Z23 Encounter for immunization: Secondary | ICD-10-CM

## 2019-04-13 DIAGNOSIS — B20 Human immunodeficiency virus [HIV] disease: Secondary | ICD-10-CM

## 2019-05-12 MED FILL — ODEFSEY 200-25-25 MG TABS: 200-25-25 | 30 days supply | Qty: 30 | Fill #3

## 2019-06-09 MED FILL — ODEFSEY 200-25-25 MG TABS: 200-25-25 | 30 days supply | Qty: 30 | Fill #4

## 2019-06-18 DIAGNOSIS — N433 Hydrocele, unspecified: Secondary | ICD-10-CM | POA: Diagnosis not present

## 2019-06-18 DIAGNOSIS — R3915 Urgency of urination: Secondary | ICD-10-CM | POA: Diagnosis not present

## 2019-06-22 ENCOUNTER — Telehealth: Payer: Self-pay

## 2019-06-22 NOTE — Telephone Encounter (Signed)
Patient walked into [facility] to speak with a nurse. Patient requesting immunization records and information on Community Covid testing for procedure that he is going to have next month.  Patient provided print out of immunizations and advised to go to drive thur testing site and [facility]. Patient also dropped off paper work for surgical clearance for hydrocelectomy. Patient filled out ROI document. LPN the urology center at [facility] in [place] to fax documents requesting what type of clearance information is needed. Urology will fax information today. Document received by patient today placed in Dr. Hale Bogus box for review. Any additional documents received will also be attached.  Kevin Keller

## 2019-06-23 NOTE — Telephone Encounter (Signed)
Patient called office today to follow up on information needed for surgery clearance. Also to schedule lab appointment to have necessary labs for clearance. Requested patient have lab orders faxed to our office before scheduling appointment to see if its something our office can do. Advised patient that Dr. Megan Salon is reviewing forms from yesterday and will notify clinical staff with next steps. Patient verbalized understanding. [place]

## 2019-06-25 ENCOUNTER — Ambulatory Visit: Payer: 59

## 2019-07-06 ENCOUNTER — Other Ambulatory Visit: Payer: Self-pay

## 2019-07-06 DIAGNOSIS — Z20822 Contact with and (suspected) exposure to covid-19: Secondary | ICD-10-CM

## 2019-07-07 LAB — NOVEL CORONAVIRUS, NAA: SARS-CoV-2, NAA: NOT DETECTED

## 2019-07-09 ENCOUNTER — Encounter: Payer: Self-pay | Admitting: Internal Medicine

## 2019-07-09 ENCOUNTER — Ambulatory Visit (HOSPITAL_COMMUNITY)
Admission: RE | Admit: 2019-07-09 | Discharge: 2019-07-09 | Disposition: A | Discharge status: Home / Self Care | Payer: 59 | Source: Ambulatory Visit | Attending: Internal Medicine | Admitting: Internal Medicine

## 2019-07-09 ENCOUNTER — Ambulatory Visit (HOSPITAL_COMMUNITY)
Admission: RE | Admit: 2019-07-09 | Discharge: 2019-07-09 | Disposition: A | Discharge status: Home / Self Care | Payer: 59 | Source: Ambulatory Visit | Attending: Family Medicine | Admitting: Family Medicine

## 2019-07-09 ENCOUNTER — Ambulatory Visit (INDEPENDENT_AMBULATORY_CARE_PROVIDER_SITE_OTHER): Payer: 59 | Admitting: Internal Medicine

## 2019-07-09 ENCOUNTER — Other Ambulatory Visit: Payer: Self-pay

## 2019-07-09 VITALS — BP 128/76 | HR 94 | Temp 98.4°F | Ht 67.0 in | Wt 219.0 lb

## 2019-07-09 DIAGNOSIS — N432 Other hydrocele: Secondary | ICD-10-CM

## 2019-07-09 DIAGNOSIS — K429 Umbilical hernia without obstruction or gangrene: Secondary | ICD-10-CM

## 2019-07-09 DIAGNOSIS — Z01818 Encounter for other preprocedural examination: Secondary | ICD-10-CM | POA: Diagnosis not present

## 2019-07-09 DIAGNOSIS — Z79899 Other long term (current) drug therapy: Secondary | ICD-10-CM | POA: Diagnosis not present

## 2019-07-09 DIAGNOSIS — N50811 Right testicular pain: Secondary | ICD-10-CM

## 2019-07-09 DIAGNOSIS — Z21 Asymptomatic human immunodeficiency virus [HIV] infection status: Secondary | ICD-10-CM

## 2019-07-09 MED FILL — HYDROCODON-APAP 5-325: 5-325 | 2 days supply | Qty: 15 | Fill #0

## 2019-07-09 NOTE — Progress Notes (Signed)
   CC: Pre-op evaluation  HPI: [name] is a 38 y.o. M w w/ PMH of HIV (Well controlled on Odefsey) who presents with request for paperwork documentation prior to hydrocelectomy surgery. He mentions that he had developed lower scrotal swelling a while ago and was diagnosed with hydrocele via ultrasound. He chose a Psychologist, sport and exercise for his hydrocelectomy in [place] and requests pre-op labs and documentation. He denies any dysuria, frequency or urgency. Does endorse dyspareunia especially with climax. Denies any fevers, chills, nausea, vomiting.  Past Medical History:  Diagnosis Date  . H/O seasonal allergies   . HIV (human immunodeficiency virus infection) ([place])    Excellent long term control as of 07/2018 ID f/u (on [place]).  . Tobacco dependence in remission     Review of Systems: Review of Systems  Constitutional: Negative for chills, fever and malaise/fatigue.  Respiratory: Negative for shortness of breath.   Cardiovascular: Negative for chest pain.  Gastrointestinal: Negative for constipation, diarrhea, nausea and vomiting.  All other systems reviewed and are negative.    Physical Exam: Vitals:   07/09/19 1437 07/09/19 1510  BP: (!) 144/81 128/76  Pulse: 94   Temp: 98.4 F (36.9 C)   TempSrc: Oral   SpO2: 96%   Weight: 219 lb (99.3 kg)   Height: '5\' 7"'$  (1.702 m)    Physical Exam  Constitutional: He is oriented to person, place, and time. He appears well-developed and well-nourished. No distress.  HENT:  Mouth/Throat: Oropharynx is clear and moist. No oropharyngeal exudate.  Eyes: Conjunctivae are normal. No scleral icterus.  Neck: Normal range of motion. Neck supple.  Cardiovascular: Normal rate, regular rhythm, normal heart sounds and intact distal pulses.  No murmur heard. Respiratory: Effort normal and breath sounds normal.  GI: Soft. Bowel sounds are normal. He exhibits mass (+ umbliical hernia). He exhibits no distension. There is no abdominal tenderness.   Musculoskeletal: Normal range of motion.        General: No edema.  Neurological: He is alert and oriented to person, place, and time.    Assessment & Plan:   Pre-op evaluation Presents for request for labs for pre-op evaluation. Documentation provided with requests for cbc, ekg, bmp, ua, urine culture, x-ray per urology. On exam, no hx of cardiac or pulmonary disease. Planned for medium risk surgery (hydrocelectomy). Able to perform >4 MET activities w/o difficulty. EKG w/ non-specific T wave inversions. Per ACS NSQIP score - 1.3% risk of serious complications, 2% risk of any complicatiosn. Considered to be below average risk for adverse events.  - CBC - Bmp - EKG performed in office: Normal sinus, normal axis, non-specific T wave inversions - Chest X-ray - UA/Urine culture  Testicular pain, right Scrotal ultrasound showing hydrocele w/o evidence of torsion. Lbas showing no evidence of infection. Planned for hydrocelectomy later this month [place] in [place], [place]. No urinary difficulties. Does have dyspareunia.  - F/u with urology    Patient discussed with Dr. Philipp Ovens   -Gilberto Better, PGY2 [facility] Pager: [phone]

## 2019-07-09 NOTE — Patient Instructions (Addendum)
Dear [name],  Thank you for allowing [place] to provide your care today. Today we discussed your hydrocele    I have ordered cbc, bmp, x-ray, ekg and ua/urine culture labs for you. I will call if any are abnormal.    Today we made no changes to your medications:    Please follow-up in 12 months.    Should you have any questions or concerns please call the internal medicine clinic at [phone].    Thank you for choosing [facility].

## 2019-07-10 LAB — BMP8+ANION GAP
Anion Gap: 15 mmol/L (ref 10.0–18.0)
BUN/Creatinine Ratio: 16 (ref 9–20)
BUN: 16 mg/dL (ref 6–20)
CO2: 21 mmol/L (ref 20–29)
Calcium: 9.5 mg/dL (ref 8.7–10.2)
Chloride: 101 mmol/L (ref 96–106)
Creatinine, Ser: 0.98 mg/dL (ref 0.76–1.27)
GFR calc Af Amer: 112 mL/min/{1.73_m2} (ref >59)
GFR calc non Af Amer: 97 mL/min/{1.73_m2} (ref >59)
Glucose: 107 mg/dL — ABNORMAL HIGH (ref 65–99)
Potassium: 4.2 mmol/L (ref 3.5–5.2)
Sodium: 137 mmol/L (ref 134–144)

## 2019-07-10 LAB — CBC WITH DIFFERENTIAL/PLATELET
Basophils Absolute: 0 10*3/uL (ref 0.0–0.2)
Basos: 1 %
EOS (ABSOLUTE): 0.1 10*3/uL (ref 0.0–0.4)
Eos: 1 %
Hematocrit: 46.2 % (ref 37.5–51.0)
Hemoglobin: 15.8 g/dL (ref 13.0–17.7)
Immature Grans (Abs): 0.1 10*3/uL (ref 0.0–0.1)
Immature Granulocytes: 1 %
Lymphocytes Absolute: 2 10*3/uL (ref 0.7–3.1)
Lymphs: 37 %
MCH: 33 pg (ref 26.6–33.0)
MCHC: 34.2 g/dL (ref 31.5–35.7)
MCV: 97 fL (ref 79–97)
Monocytes Absolute: 0.6 10*3/uL (ref 0.1–0.9)
Monocytes: 10 %
Neutrophils Absolute: 2.7 10*3/uL (ref 1.4–7.0)
Neutrophils: 50 %
Platelets: 299 10*3/uL (ref 150–450)
RBC: 4.79 x10E6/uL (ref 4.14–5.80)
RDW: 11.8 % (ref 11.6–15.4)
WBC: 5.4 10*3/uL (ref 3.4–10.8)

## 2019-07-10 LAB — URINALYSIS, ROUTINE W REFLEX MICROSCOPIC
Bilirubin, UA: NEGATIVE
Glucose, UA: NEGATIVE
Ketones, UA: NEGATIVE
Leukocytes,UA: NEGATIVE
Nitrite, UA: NEGATIVE
Protein,UA: NEGATIVE
RBC, UA: NEGATIVE
Specific Gravity, UA: 1.023 (ref 1.005–1.030)
Urobilinogen, Ur: 0.2 mg/dL (ref 0.2–1.0)
pH, UA: 5.5 (ref 5.0–7.5)

## 2019-07-10 MED FILL — ODEFSEY 200-25-25 MG TABS: 200-25-25 | 30 days supply | Qty: 30 | Fill #0

## 2019-07-11 ENCOUNTER — Encounter: Payer: Self-pay | Admitting: Internal Medicine

## 2019-07-11 DIAGNOSIS — Z01818 Encounter for other preprocedural examination: Secondary | ICD-10-CM | POA: Insufficient documentation

## 2019-07-11 LAB — URINE CULTURE

## 2019-07-11 NOTE — Assessment & Plan Note (Addendum)
Presents for request for labs for pre-op evaluation. Documentation provided with requests for cbc, ekg, bmp, ua, urine culture, x-ray per urology. On exam, no hx of cardiac or pulmonary disease. Planned for medium risk surgery (hydrocelectomy). Able to perform >4 MET activities w/o difficulty. EKG w/ non-specific T wave inversions. Per ACS NSQIP score - 1.3% risk of serious complications, 2% risk of any complicatiosn. Considered to be below average risk for adverse events.  - CBC - Bmp - EKG performed in office: Normal sinus, normal axis, non-specific T wave inversions - Chest X-ray - UA/Urine culture

## 2019-07-11 NOTE — Assessment & Plan Note (Signed)
Scrotal ultrasound showing hydrocele w/o evidence of torsion. Lbas showing no evidence of infection. Planned for hydrocelectomy later this month up in [place], [place]. No urinary difficulties. Does have dyspareunia.  - F/u with urology

## 2019-07-13 NOTE — Progress Notes (Signed)
Internal Medicine Clinic Attending ° °Case discussed with Dr. Lee at the time of the visit.  We reviewed the resident’s history and exam and pertinent patient test results.  I agree with the assessment, diagnosis, and plan of care documented in the resident’s note.  °

## 2019-07-14 ENCOUNTER — Telehealth: Payer: Self-pay | Admitting: Internal Medicine

## 2019-07-14 NOTE — Telephone Encounter (Signed)
Spoke with [place] regarding results of his labs and reassured that all the labs and notes will be faxed over to the urology office.

## 2019-07-20 ENCOUNTER — Other Ambulatory Visit: Payer: Self-pay

## 2019-07-20 DIAGNOSIS — Z20822 Contact with and (suspected) exposure to covid-19: Secondary | ICD-10-CM

## 2019-07-22 DIAGNOSIS — B20 Human immunodeficiency virus [HIV] disease: Secondary | ICD-10-CM | POA: Diagnosis not present

## 2019-07-22 DIAGNOSIS — N433 Hydrocele, unspecified: Secondary | ICD-10-CM | POA: Diagnosis not present

## 2019-07-22 DIAGNOSIS — Z87891 Personal history of nicotine dependence: Secondary | ICD-10-CM | POA: Diagnosis not present

## 2019-07-22 DIAGNOSIS — R3915 Urgency of urination: Secondary | ICD-10-CM | POA: Diagnosis not present

## 2019-07-22 LAB — NOVEL CORONAVIRUS, NAA: SARS-CoV-2, NAA: NOT DETECTED

## 2019-08-03 MED FILL — HYDROCODON-APAP 5-325: 5-325 | 2 days supply | Qty: 12 | Fill #0

## 2019-08-07 ENCOUNTER — Other Ambulatory Visit: Payer: Self-pay | Admitting: Urology

## 2019-08-07 DIAGNOSIS — N433 Hydrocele, unspecified: Secondary | ICD-10-CM

## 2019-08-10 ENCOUNTER — Other Ambulatory Visit: Payer: Self-pay | Admitting: Internal Medicine

## 2019-08-10 DIAGNOSIS — B20 Human immunodeficiency virus [HIV] disease: Secondary | ICD-10-CM

## 2019-08-10 MED FILL — ODEFSEY 200-25-25 MG TABS: 200-25-25 | 30 days supply | Qty: 30 | Fill #0

## 2019-08-11 ENCOUNTER — Other Ambulatory Visit: Payer: Self-pay

## 2019-08-11 ENCOUNTER — Other Ambulatory Visit: Payer: 59

## 2019-08-11 ENCOUNTER — Ambulatory Visit (INDEPENDENT_AMBULATORY_CARE_PROVIDER_SITE_OTHER): Payer: 59

## 2019-08-11 DIAGNOSIS — B20 Human immunodeficiency virus [HIV] disease: Secondary | ICD-10-CM

## 2019-08-11 DIAGNOSIS — Z23 Encounter for immunization: Secondary | ICD-10-CM | POA: Diagnosis not present

## 2019-08-12 LAB — T-HELPER CELL (CD4) - (RCID CLINIC ONLY)
CD4 % Helper T Cell: 28 % — ABNORMAL LOW (ref 33–65)
CD4 T Cell Abs: 578 /uL (ref 400–1790)

## 2019-08-22 LAB — COMPREHENSIVE METABOLIC PANEL
AG Ratio: 1.2 (calc) (ref 1.0–2.5)
ALT: 29 U/L (ref 9–46)
AST: 18 U/L (ref 10–40)
Albumin: 4 g/dL (ref 3.6–5.1)
Alkaline phosphatase (APISO): 73 U/L (ref 36–130)
BUN: 16 mg/dL (ref 7–25)
CO2: 28 mmol/L (ref 20–32)
Calcium: 9.3 mg/dL (ref 8.6–10.3)
Chloride: 105 mmol/L (ref 98–110)
Creat: 0.86 mg/dL (ref 0.60–1.35)
Globulin: 3.3 g/dL (calc) (ref 1.9–3.7)
Glucose, Bld: 91 mg/dL (ref 65–99)
Potassium: 4.1 mmol/L (ref 3.5–5.3)
Sodium: 140 mmol/L (ref 135–146)
Total Bilirubin: 0.4 mg/dL (ref 0.2–1.2)
Total Protein: 7.3 g/dL (ref 6.1–8.1)

## 2019-08-22 LAB — HIV-1 RNA QUANT-NO REFLEX-BLD
HIV 1 RNA Quant: <20 copies/mL — AB
HIV-1 RNA Quant, Log: <1.3 Log copies/mL — AB

## 2019-08-22 LAB — CBC
HCT: 45.1 % (ref 38.5–50.0)
Hemoglobin: 15.7 g/dL (ref 13.2–17.1)
MCH: 33.4 pg — ABNORMAL HIGH (ref 27.0–33.0)
MCHC: 34.8 g/dL (ref 32.0–36.0)
MCV: 96 fL (ref 80.0–100.0)
MPV: 9.7 fL (ref 7.5–12.5)
Platelets: 347 10*3/uL (ref 140–400)
RBC: 4.7 10*6/uL (ref 4.20–5.80)
RDW: 11.7 % (ref 11.0–15.0)
WBC: 5.5 10*3/uL (ref 3.8–10.8)

## 2019-08-22 LAB — LIPID PANEL
Cholesterol: 230 mg/dL — ABNORMAL HIGH (ref <200)
HDL: 41 mg/dL (ref >=40)
LDL Cholesterol (Calc): 150 mg/dL (calc) — ABNORMAL HIGH
Non-HDL Cholesterol (Calc): 189 mg/dL (calc) — ABNORMAL HIGH (ref <130)
Total CHOL/HDL Ratio: 5.6 (calc) — ABNORMAL HIGH (ref <5.0)
Triglycerides: 244 mg/dL — ABNORMAL HIGH (ref <150)

## 2019-08-22 LAB — RPR: RPR Ser Ql: NONREACTIVE

## 2019-08-25 ENCOUNTER — Encounter: Payer: 59 | Admitting: Internal Medicine

## 2019-08-25 ENCOUNTER — Ambulatory Visit: Payer: 59

## 2019-08-25 NOTE — Patient Instructions (Signed)
Error

## 2019-08-26 ENCOUNTER — Ambulatory Visit
Admission: RE | Admit: 2019-08-26 | Discharge: 2019-08-26 | Disposition: A | Discharge status: Home / Self Care | Payer: 59 | Source: Ambulatory Visit | Attending: Urology | Admitting: Urology

## 2019-08-26 DIAGNOSIS — N433 Hydrocele, unspecified: Secondary | ICD-10-CM

## 2019-09-14 MED FILL — ODEFSEY 200-25-25 MG TABS: 200-25-25 | 30 days supply | Qty: 30 | Fill #0

## 2019-09-22 ENCOUNTER — Other Ambulatory Visit: Payer: Self-pay

## 2019-09-22 ENCOUNTER — Ambulatory Visit (INDEPENDENT_AMBULATORY_CARE_PROVIDER_SITE_OTHER): Payer: 59 | Admitting: Internal Medicine

## 2019-09-22 DIAGNOSIS — B20 Human immunodeficiency virus [HIV] disease: Secondary | ICD-10-CM | POA: Diagnosis not present

## 2019-09-22 NOTE — Progress Notes (Signed)
Virtual Visit via Telephone Note  I connected with Kevin Keller on 09/22/19 at  1:45 PM EST by telephone and verified that I am speaking with the correct person using two identifiers.  Location: Patient: Home Provider: [facility]   I discussed the limitations, risks, security and privacy concerns of performing an evaluation and management service by telephone and the availability of in person appointments. I also discussed with the patient that there may be a patient responsible charge related to this service. The patient expressed understanding and agreed to proceed.   History of Present Illness: I spoke to Kevin Keller today.  He has not had any problems obtaining, taking or tolerating his Odefsey and, as usual, never misses any doses.  He underwent surgery for his right testicle hydrocele but had an even worse recurrence of the hydrocele postop.  He says that his right testicle is about the size of a baseball.  He is in a lot of pain.  He is currently scheduled for repeat surgery on 10/21/2019.  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   Observations/Objective: HIV 1 RNA Quant (copies/mL)  Date Value  08/11/2019 <20 DETECTED (A)  01/22/2019 <20 NOT DETECTED  07/09/2018 <20 DETECTED (A)   CD4 T Cell Abs (/uL)  Date Value  08/11/2019 578  01/22/2019 485  07/09/2018 620     Assessment and Plan: His infection remains under excellent, long-term control.  He will continue Odefsey and follow-up after lab work in 6 months.  Follow Up Instructions:    I discussed the assessment and treatment plan with the patient. The patient was provided an opportunity to ask questions and all were answered. The patient agreed with the plan and demonstrated an understanding of the instructions.   The patient was advised to call back or seek an in-person evaluation if the symptoms worsen or if the condition fails to improve as  anticipated.  I provided 15 minutes of non-face-to-face time during this encounter.   [name] Asters, MD

## 2019-09-25 ENCOUNTER — Encounter: Payer: Self-pay | Admitting: Internal Medicine

## 2019-09-29 ENCOUNTER — Telehealth: Payer: Self-pay | Admitting: Internal Medicine

## 2019-09-29 NOTE — Telephone Encounter (Signed)
Received message from Mr.Dick regarding urgent request for a call. Attempted to call Mr.Navarro this morning. Went to [employer]. Left voicemail stating I will try to call back at a later time.

## 2019-09-29 NOTE — Telephone Encounter (Signed)
Attempted to call Mr.Kelven again. Patient did not pick up. Will try again tomorrow.

## 2019-09-29 NOTE — Telephone Encounter (Signed)
Pt returning call .  Patient states he has some concerns about his upcoming Urology Surgery that has now been moved to Urgent and is sch for this Friday 10/02/2019.

## 2019-10-02 DIAGNOSIS — B20 Human immunodeficiency virus [HIV] disease: Secondary | ICD-10-CM | POA: Diagnosis not present

## 2019-10-02 DIAGNOSIS — N433 Hydrocele, unspecified: Secondary | ICD-10-CM | POA: Diagnosis not present

## 2019-10-02 DIAGNOSIS — S3022XA Contusion of scrotum and testes, initial encounter: Secondary | ICD-10-CM | POA: Diagnosis not present

## 2019-10-06 ENCOUNTER — Ambulatory Visit (INDEPENDENT_AMBULATORY_CARE_PROVIDER_SITE_OTHER): Payer: 59 | Admitting: Internal Medicine

## 2019-10-06 VITALS — BP 133/79 | HR 84 | Temp 98.2°F | Ht 67.0 in | Wt 218.1 lb

## 2019-10-06 DIAGNOSIS — F909 Attention-deficit hyperactivity disorder, unspecified type: Secondary | ICD-10-CM | POA: Insufficient documentation

## 2019-10-06 DIAGNOSIS — Z020 Encounter for examination for admission to educational institution: Secondary | ICD-10-CM

## 2019-10-06 DIAGNOSIS — N50811 Right testicular pain: Secondary | ICD-10-CM

## 2019-10-06 DIAGNOSIS — F901 Attention-deficit hyperactivity disorder, predominantly hyperactive type: Secondary | ICD-10-CM | POA: Diagnosis not present

## 2019-10-06 DIAGNOSIS — Z79899 Other long term (current) drug therapy: Secondary | ICD-10-CM

## 2019-10-06 DIAGNOSIS — Z21 Asymptomatic human immunodeficiency virus [HIV] infection status: Secondary | ICD-10-CM

## 2019-10-06 DIAGNOSIS — N5089 Other specified disorders of the male genital organs: Secondary | ICD-10-CM | POA: Diagnosis not present

## 2019-10-06 NOTE — Assessment & Plan Note (Deleted)
Mr.Haugan presenting with complaints of difficulty focusing. Does mention hx of childhood ADHD although unable to confirm. Mentions starting nursing school recently and having difficulty performing school-related tasks due to being distracted easily and going off on tangents. Denies any evidence of depression, insomnia, heat-intolerance, weight loss.  DIVA 2.0 diagnostic interview performed in office today with Mr.Partington meeting 4/9 for both adult and childhood A1 attention deficit criteria and 6/9 for both and childhood A2 hyeractivity-impulsivity criteria. Also endorse family history and collateral information from partner. Consistent with diagnosis of 314.01 ADHD w/ hyperactive-impulsive type.  Will start by getting urine tox screen to assess for any alternative causes of difficulty concentrating although history and diagnostic interview suggest Adult ADHD. Would not start treatment at this time as he is currently recovering from a urological procedure but will follow up once he recovers.  - Urine drug screen

## 2019-10-06 NOTE — Patient Instructions (Signed)
Thank you for allowing [place] to provide your care today. Today we discussed your difficulty with concentration    I have ordered urine drug screen labs for you. I will call if any are abnormal.    Today we made no changes to your medications.    Please follow-up in 4 weeks.    Should you have any questions or concerns please call the internal medicine clinic at [phone].     Living With Attention Deficit Hyperactivity Disorder If you have been diagnosed with attention deficit hyperactivity disorder (ADHD), you may be relieved that you now know why you have felt or behaved a certain way. Still, you may feel overwhelmed about the treatment ahead. You may also wonder how to get the support you need and how to deal with the condition day-to-day. With treatment and support, you can live with ADHD and manage your symptoms. How to manage lifestyle changes Managing stress Stress is your body's reaction to life changes and events, both good and bad. To cope with the stress of an ADHD diagnosis, it may help to:  Learn more about ADHD.  Exercise regularly. Even a short daily walk can lower stress levels.  Participate in training or education programs (including social skills training classes) that teach you to deal with symptoms.  Medicines Your health care provider may suggest certain medicines if he or she feels that they will help to improve your condition. Stimulant medicines are usually prescribed to treat ADHD, and therapy may also be prescribed. It is important to:  Avoid using alcohol and other substances that may prevent your medicines from working properly [facility]).  Talk with your pharmacist or health care provider about all the medicines that you take, their possible side effects, and what medicines are safe to take together.  Make it your goal to take part in all treatment decisions (shared decision-making). Ask about possible side effects of medicines that your health care  provider recommends, and tell him or her how you feel about having those side effects. It is best if shared decision-making with your health care provider is part of your total treatment plan. Relationships To strengthen your relationships with family members while treating your condition, consider taking part in family therapy. You might also attend self-help groups alone or with a loved one. Be honest about how your symptoms affect your relationships. Make an effort to communicate respectfully instead of fighting, and find ways to show others that you care. Psychotherapy may be useful in helping you cope with how ADHD affects your relationships. How to recognize changes in your condition The following signs may mean that your treatment is working well and your condition is improving:  Consistently being on time for appointments.  Being more organized at home and work.  Other people noticing improvements in your behavior.  Achieving goals that you set for yourself.  Thinking more clearly. The following signs may mean that your treatment is not working very well:  Feeling impatience or more confusion.  Missing, forgetting, or being late for appointments.  An increasing sense of disorganization and messiness.  More difficulty in reaching goals that you set for yourself.  Loved ones becoming angry or frustrated with you. Where to find support Talking to others  Keep emotion out of important discussions and speak in a calm, logical way.  Listen closely and patiently to your loved ones. Try to understand their point of view, and try to avoid getting defensive.  Take responsibility for the consequences of  your actions.  Ask that others do not take your behaviors personally.  Aim to solve problems as they come up, and express your feelings instead of bottling them up.  Talk openly about what you need from your loved ones and how they can support you.  Consider going to family  therapy sessions or having your family meet with a specialist who deals with ADHD-related behavior problems. Finances Not all insurance plans cover mental health care, so it is important to check with your insurance carrier. If paying for co-pays or counseling services is a problem, search for a local or county mental health care center. Public mental health care services may be offered there at a low cost or no cost when you are not able to see a private health care provider. If you are taking medicine for ADHD, you may be able to get the generic form, which may be less expensive than brand-name medicine. Some makers of prescription medicines also offer help to patients who cannot afford the medicines that they need. Follow these instructions at home:  Take over-the-counter and prescription medicines only as told by your health care provider. Check with your health care provider before taking any new medicines.  Create structure and an organized atmosphere at home. For example: ? Make a list of tasks, then rank them from most important to least important. Work on one task at a time until your listed tasks are done. ? Make a daily schedule and follow it consistently every day. ? Use an appointment calendar, and check it 2 or 3 times a day to keep on track. Keep it with you when you leave the house. ? Create spaces where you keep certain things, and always put things back in their places after you use them.  Keep all follow-up visits as told by your health care provider. This is important. Questions to ask your health care provider:  What are the risks and benefits of taking medicines?  Would I benefit from therapy?  How often should I follow up with a health care provider? Contact a health care provider if:  You have side effects from your medicines, such as: ? Repeated muscle twitches, coughing, or speech outbursts. ? Sleep problems. ? Loss of appetite. ? Depression. ? New or worsening  behavior problems. ? Dizziness. ? Unusually fast heartbeat. ? Stomach pains. ? Headaches. Get help right away if:  You have a severe reaction to a medicine.  Your behavior suddenly gets worse. Summary  With treatment and support, you can live with ADHD and manage your symptoms.  The medicines that are most often prescribed for ADHD are stimulants.  Consider taking part in family therapy or self-help groups with family members or friends.  When you talk with friends and family about your ADHD, be patient and communicate openly.  Take over-the-counter and prescription medicines only as told by your health care provider. Check with your health care provider before taking any new medicines. This information is not intended to replace advice given to you by your health care provider. Make sure you discuss any questions you have with your health care provider. Document Revised: 12/12/2018 Document Reviewed: 12/20/2016 Elsevier Patient Education  [place].

## 2019-10-07 ENCOUNTER — Encounter: Payer: Self-pay | Admitting: Internal Medicine

## 2019-10-07 DIAGNOSIS — N5089 Other specified disorders of the male genital organs: Secondary | ICD-10-CM | POA: Insufficient documentation

## 2019-10-07 DIAGNOSIS — F901 Attention-deficit hyperactivity disorder, predominantly hyperactive type: Secondary | ICD-10-CM | POA: Insufficient documentation

## 2019-10-07 NOTE — Assessment & Plan Note (Addendum)
[name] presenting with complaints of difficulty focusing. Does mention hx of childhood ADHD although unable to confirm. Mentions starting nursing school recently and having difficulty performing school-related tasks due to being distracted easily and going off on tangents. Denies any evidence of depression, insomnia, heat-intolerance, weight loss.  DIVA 2.0 diagnostic interview performed in office today with [name] meeting 4/9 for both adult and childhood A1 attention deficit criteria and 6/9 for both and childhood A2 hyeractivity-impulsivity criteria. Also endorse family history and collateral information from partner. Consistent with diagnosis of 314.01 ADHD w/ hyperactive-impulsive type.  Will start by getting urine tox screen to assess for any alternative causes of difficulty concentrating although history and diagnostic interview suggest Adult ADHD. Would not start treatment at this time as he is currently recovering from a urological procedure but will follow up once he recovers.  - Urine drug screen - Fill out controlled substance contract at next visit if no red flags

## 2019-10-07 NOTE — Assessment & Plan Note (Signed)
Requesting screening labs for school. Unable to load up all the information required. Patient will send mychart message to me once he is able to look it up. Future orders for hep B, varicella, TB ordered

## 2019-10-07 NOTE — Assessment & Plan Note (Signed)
Mentions that he recently had scrotal swelling and was seen by urology at [facility] and had I&D performed which revealed bloody drainage. Unable to access records but per description from Mr.Houseworth, possibly a hematoma. Currently on hydrocodone for pain and recently finished course of antibiotics. Pain is well managed and endorses no dysuria, hesitancy, frequency.  - C/w f/u with urology

## 2019-10-07 NOTE — Progress Notes (Signed)
CC: Trouble focusing  HPI: [name] is a 39 y.o. M w/ PMH of well-controlled HIV on [place] presenting with complaints of difficulty focusing. He mentions that he was diagnosed with ADHD when he was a child and had previously been on Ritalin. 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 He especially mentions 'going off' on tangents during conversations and having trouble focusing on task without getting distracted. He mentions significant family history with his brother also having been prescribed Ritalin in the past. Denies any significant anxiety, insomnia, hallucinations, illicit substance use, depressed mood, loss of appetitie, anhedonia or palpitations.  Past Medical History:  Diagnosis Date  . H/O seasonal allergies   . HIV (human immunodeficiency virus infection) ([place])    Excellent long term control as of 07/2018 ID f/u (on [place]).  . Tobacco dependence in remission     Review of Systems: Review of Systems  Constitutional: Negative for chills, fever and malaise/fatigue.  Eyes: Negative for blurred vision.  Respiratory: Negative for shortness of breath.   Cardiovascular: Negative for chest pain, palpitations and leg swelling.  Gastrointestinal: Negative for constipation, diarrhea, nausea and vomiting.  Neurological: Negative for dizziness.  Psychiatric/Behavioral: Negative for depression and hallucinations.     Physical Exam: Vitals:   10/06/19 1531  BP: 133/79  Pulse: 84  Temp: 98.2 F (36.8 C)  TempSrc: Oral  SpO2: 100%  Weight: 218 lb 1.6 oz (98.9 kg)  Height: 5\' 7"  (1.702 m)   Physical Exam  Constitutional: He is oriented to person, place, and time. He appears well-developed and well-nourished. No distress.  HENT:  Mouth/Throat: Oropharynx is clear and moist.  Neck: No thyromegaly present.    Cardiovascular: Normal rate, regular rhythm, normal heart sounds and intact distal pulses.  No murmur heard. Respiratory: Effort normal and breath sounds normal. He has no wheezes. He has no rales.  GI: Soft. Bowel sounds are normal. He exhibits no distension. There is no abdominal tenderness.  Musculoskeletal:        General: Normal range of motion.     Cervical back: Normal range of motion and neck supple.  Neurological: He is alert and oriented to person, place, and time. No cranial nerve deficit.  Skin: Skin is warm and dry.  Psychiatric: He has a normal mood and affect.  Distracted during conversation     Assessment & Plan:   Attention deficit hyperactivity disorder (ADHD), predominantly hyperactive type Mr.Welge presenting with complaints of difficulty focusing. Does mention hx of childhood ADHD although unable to confirm. Mentions starting nursing school recently and having difficulty performing school-related tasks due to being distracted easily and going off on tangents. Denies any evidence of depression, insomnia, heat-intolerance, weight loss.  DIVA 2.0 diagnostic interview performed in office today with [place] meeting 4/9 for both adult and childhood A1 attention deficit criteria and 6/9 for both and childhood A2 hyeractivity-impulsivity criteria. Also endorse family history and collateral information from partner. Consistent with diagnosis of 314.01 ADHD w/ hyperactive-impulsive type.  Will start by getting urine tox screen to assess for any alternative causes of difficulty concentrating although history and diagnostic interview suggest Adult ADHD. Would not start treatment at this time as he is currently recovering from a urological procedure but will follow up once he recovers.  - Urine drug screen  Encounter for school history and physical examination Requesting  screening labs for school. Unable to load up all the information required. Patient will send mychart message  to me once he is able to look it up. Future orders for hep B, varicella, TB ordered  Scrotal swelling Mentions that he recently had scrotal swelling and was seen by urology at [facility] and had I&D performed which revealed bloody drainage. Unable to access records but per description from Kevin Keller, possibly a hematoma. Currently on hydrocodone for pain and recently finished course of antibiotics. Pain is well managed and endorses no dysuria, hesitancy, frequency.  - C/w f/u with urology    Patient discussed with Dr. Antony Contras   -Judeth Cornfield, PGY2 [facility] Internal Medicine Pager: [phone]

## 2019-10-08 ENCOUNTER — Encounter: Payer: Self-pay | Admitting: Internal Medicine

## 2019-10-08 DIAGNOSIS — F901 Attention-deficit hyperactivity disorder, predominantly hyperactive type: Secondary | ICD-10-CM

## 2019-10-08 LAB — TOXASSURE SELECT,+ANTIDEPR,UR

## 2019-10-08 NOTE — Progress Notes (Signed)
Internal Medicine Clinic Attending ° °Case discussed with Dr. Lee at the time of the visit.  We reviewed the resident’s history and exam and pertinent patient test results.  I agree with the assessment, diagnosis, and plan of care documented in the resident’s note.  °

## 2019-10-12 MED FILL — ODEFSEY 200-25-25 MG TABS: 200-25-25 | 30 days supply | Qty: 30 | Fill #1

## 2019-10-13 ENCOUNTER — Other Ambulatory Visit (INDEPENDENT_AMBULATORY_CARE_PROVIDER_SITE_OTHER): Payer: 59

## 2019-10-13 DIAGNOSIS — Z020 Encounter for examination for admission to educational institution: Secondary | ICD-10-CM

## 2019-10-13 MED ORDER — AMPHETAMINE-DEXTROAMPHET ER 20 MG PO CP24: 20.0000 mg | ORAL_CAPSULE | Freq: Every day | ORAL | 0 refills | Status: DC

## 2019-10-13 MED FILL — ADDERALL XR 20 MG CAP SA: 20 | 30 days supply | Qty: 30 | Fill #0

## 2019-10-13 NOTE — Telephone Encounter (Signed)
Spoke with Kevin Keller regarding his need for immunization records and titers for MMR and Varicella. Kevin Keller will plan to discuss with his Work HR to fax over immunization records to consolidate his health information. Also discussed with Kevin Keller regarding starting treatment for his ADHD. Discussed positive findings on his questionnaire and indication for starting treatment. Had conversation regarding risks and benefits and importance of adherence to prescribed dosing. Also discussed need to sign controlled substance contract at next visit. Script for Adderall XR sent.

## 2019-10-13 NOTE — Assessment & Plan Note (Signed)
Discussed w/ Kevin Keller regarding starting treatment for ADHD. Urine drug screen revealed no significant abnormalities. Findings of DIVA 2.0 diagnostic interview confirms the diagnosis. Discussed importance of adherence and need to formally sign controlled substance contract at next visit. Confirmed finishing his recovery from his urological procedure and completion of his pain meds and antibiotics. All other questions and concerns addressed.  - Start Adderall XR 20mg  daily

## 2019-10-16 LAB — MEASLES/MUMPS/RUBELLA IMMUNITY
MUMPS ABS, IGG: 68.6 AU/mL (ref >10.9)
RUBEOLA AB, IGG: <13.5 AU/mL — ABNORMAL LOW (ref >16.4)
Rubella Antibodies, IGG: 3.37 index (ref >0.99)

## 2019-10-16 LAB — QUANTIFERON-TB GOLD PLUS
QuantiFERON Mitogen Value: >10 IU/mL
QuantiFERON Nil Value: 0.04 IU/mL
QuantiFERON TB1 Ag Value: 0.05 IU/mL
QuantiFERON TB2 Ag Value: 0.04 IU/mL
QuantiFERON-TB Gold Plus: NEGATIVE

## 2019-10-16 LAB — VARICELLA ZOSTER ABS, IGG/IGM
Varicella IgM: <0.91 index (ref 0.00–0.90)
Varicella zoster IgG: 1458 index (ref >165)

## 2019-10-17 ENCOUNTER — Telehealth: Payer: Self-pay | Admitting: Internal Medicine

## 2019-10-17 NOTE — Telephone Encounter (Signed)
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 Informed him the forms are ready for pick-up. He states he will pick them up on Monday.

## 2019-10-20 ENCOUNTER — Telehealth: Payer: Self-pay

## 2019-10-20 NOTE — Telephone Encounter (Signed)
Requesting to speak with a nurse about vaccination. Please call pt back.  

## 2019-10-20 NOTE — Telephone Encounter (Signed)
RTC, no answer, HIPPA compliant message left on VM to call back. SChaplin, RN,BSN

## 2019-10-20 NOTE — Telephone Encounter (Signed)
Received call today from patient requesting proof of immunization against Hep B. States he is unable to do clinicals until he has this turned in. Is not sure if he has had this done, and if not could he schedule appointment with office for these vaccines. Will forward message to md. Lorenso Courier, CMA

## 2019-10-20 NOTE — Telephone Encounter (Signed)
His hepatitis B surface antibody was positive in 2017 proving immunity, either through previous vaccination or natural infection.  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

## 2019-10-20 NOTE — Telephone Encounter (Signed)
Spoke to pt in length, he was calling about many items- drug screen, hep B and f/u with dr Nedra Hai, he stated he is too busy at present to schedule a f/u appt but was given dr lee's available dates in march and he will decide. Dr Nedra Hai when you return you may want to give him a call to discuss last appt

## 2019-10-21 NOTE — Telephone Encounter (Signed)
Attempted to call patient to offer labs from 2017 for clinicals. Left voicemail requesting patient call office back. Lorenso Courier, [place]

## 2019-10-27 NOTE — Telephone Encounter (Signed)
Attempted to call Mr.Dutta regarding concerns for lab result. Mr.Brecheen did not pick up. Left voicemail w/ callback number.

## 2019-11-04 ENCOUNTER — Other Ambulatory Visit: Payer: Self-pay | Admitting: *Deleted

## 2019-11-04 ENCOUNTER — Encounter: Payer: Self-pay | Admitting: Internal Medicine

## 2019-11-04 DIAGNOSIS — F901 Attention-deficit hyperactivity disorder, predominantly hyperactive type: Secondary | ICD-10-CM

## 2019-11-05 MED ORDER — AMPHETAMINE-DEXTROAMPHET ER 20 MG PO CP24: 20.0000 mg | ORAL_CAPSULE | Freq: Every day | ORAL | 0 refills | Status: DC

## 2019-11-05 NOTE — Telephone Encounter (Signed)
Called and spoke with Kevin Keller regarding his response to his stimulant therapy for his ADHD. He denies any significant side effects such as chest pain, palpitations, blurry vision, headache. Discussed importance of medication adherence and avoiding additional doses. Kevin Keller expressed understanding.

## 2019-11-09 MED FILL — ODEFSEY 200-25-25 MG TABS: 200-25-25 | 30 days supply | Qty: 30 | Fill #2

## 2019-11-10 MED FILL — ADDERALL XR 20 MG CAP SA: 20 | 30 days supply | Qty: 30 | Fill #0

## 2019-11-30 ENCOUNTER — Other Ambulatory Visit: Payer: Self-pay | Admitting: Internal Medicine

## 2019-11-30 DIAGNOSIS — F901 Attention-deficit hyperactivity disorder, predominantly hyperactive type: Secondary | ICD-10-CM

## 2019-12-03 ENCOUNTER — Encounter: Payer: Self-pay | Admitting: Internal Medicine

## 2019-12-03 NOTE — Telephone Encounter (Signed)
Mr.Barge mentions that he received a message stating that he has an appointment with me tomorrow. Discussed that tomorrow the clinic is closed and the message may have been an erroneous automated message. Mr.Sliva expressed understanding. All other questions and concerns addressed.

## 2019-12-08 MED FILL — ADDERALL XR 20 MG CAP SA: 20 | 30 days supply | Qty: 30 | Fill #0

## 2019-12-08 MED FILL — ODEFSEY 200-25-25 MG TABS: 200-25-25 | 30 days supply | Qty: 30 | Fill #3

## 2020-01-07 MED FILL — ODEFSEY 200-25-25 MG TABS: 200-25-25 | 30 days supply | Qty: 30 | Fill #4

## 2020-01-08 ENCOUNTER — Other Ambulatory Visit: Payer: Self-pay | Admitting: Internal Medicine

## 2020-01-08 ENCOUNTER — Encounter: Payer: Self-pay | Admitting: Internal Medicine

## 2020-01-08 DIAGNOSIS — F901 Attention-deficit hyperactivity disorder, predominantly hyperactive type: Secondary | ICD-10-CM

## 2020-01-08 MED ORDER — AMPHETAMINE-DEXTROAMPHET ER 20 MG PO CP24: ORAL_CAPSULE | ORAL | 0 refills | Status: DC

## 2020-01-08 MED FILL — ADDERALL XR 20 MG CAP SA: 20 | 30 days supply | Qty: 30 | Fill #0

## 2020-01-08 NOTE — Telephone Encounter (Signed)
Pt is requesting a call back [phone]

## 2020-01-26 ENCOUNTER — Other Ambulatory Visit: Payer: Self-pay | Admitting: Internal Medicine

## 2020-01-26 DIAGNOSIS — F901 Attention-deficit hyperactivity disorder, predominantly hyperactive type: Secondary | ICD-10-CM

## 2020-02-02 ENCOUNTER — Encounter: Payer: Self-pay | Admitting: Internal Medicine

## 2020-02-05 MED FILL — ODEFSEY 200-25-25 MG TABS: 200-25-25 | 30 days supply | Qty: 30 | Fill #0

## 2020-03-07 MED FILL — ODEFSEY 200-25-25 MG TABS: 200-25-25 | 30 days supply | Qty: 30 | Fill #1

## 2020-03-08 ENCOUNTER — Encounter: Payer: Self-pay | Admitting: Internal Medicine

## 2020-03-08 ENCOUNTER — Other Ambulatory Visit: Payer: Self-pay | Admitting: Internal Medicine

## 2020-03-08 DIAGNOSIS — F901 Attention-deficit hyperactivity disorder, predominantly hyperactive type: Secondary | ICD-10-CM

## 2020-03-08 MED FILL — ADDERALL XR 20 MG CAP SA: 20 | 30 days supply | Qty: 30 | Fill #0

## 2020-03-22 ENCOUNTER — Other Ambulatory Visit: Payer: 59

## 2020-03-23 ENCOUNTER — Other Ambulatory Visit: Payer: Self-pay

## 2020-03-23 ENCOUNTER — Other Ambulatory Visit: Payer: 59

## 2020-03-23 DIAGNOSIS — B20 Human immunodeficiency virus [HIV] disease: Secondary | ICD-10-CM | POA: Diagnosis not present

## 2020-03-24 LAB — T-HELPER CELL (CD4) - (RCID CLINIC ONLY)
CD4 % Helper T Cell: 27 % — ABNORMAL LOW (ref 33–65)
CD4 T Cell Abs: 629 /uL (ref 400–1790)

## 2020-03-25 LAB — HIV-1 RNA QUANT-NO REFLEX-BLD
HIV 1 RNA Quant: <20 copies/mL
HIV-1 RNA Quant, Log: <1.3 Log copies/mL

## 2020-04-04 ENCOUNTER — Other Ambulatory Visit: Payer: Self-pay

## 2020-04-04 ENCOUNTER — Encounter: Payer: Self-pay | Admitting: Internal Medicine

## 2020-04-04 ENCOUNTER — Encounter: Payer: Self-pay | Admitting: *Deleted

## 2020-04-04 ENCOUNTER — Ambulatory Visit (INDEPENDENT_AMBULATORY_CARE_PROVIDER_SITE_OTHER): Payer: 59 | Admitting: Internal Medicine

## 2020-04-04 ENCOUNTER — Other Ambulatory Visit: Payer: Self-pay | Admitting: Internal Medicine

## 2020-04-04 DIAGNOSIS — B20 Human immunodeficiency virus [HIV] disease: Secondary | ICD-10-CM

## 2020-04-04 MED ORDER — ODEFSEY 200-25-25 MG PO TABS: 1.0000 | ORAL_TABLET | Freq: Every day | ORAL | 11 refills | Status: DC

## 2020-04-04 NOTE — Assessment & Plan Note (Signed)
His infection remains under excellent, long-term control.  He will continue Odefsey and follow-up after lab work in 1 year. ?

## 2020-04-04 NOTE — Progress Notes (Signed)
Patient Active Problem List   Diagnosis Date Noted  . Human immunodeficiency virus (HIV) disease (HCC) 05/07/2014    Priority: High  . Obesity 01/17/2017    Priority: Medium  . Attention deficit hyperactivity disorder (ADHD), predominantly hyperactive type 10/07/2019  . Scrotal swelling 10/07/2019  . Umbilical hernia 11/10/2018  . Tinea cruris 11/20/2017  . Dyslipidemia 01/24/2016  . History of genital warts 07/06/2014  . History of chlamydia infection 07/06/2014  . H/O seasonal allergies 07/05/2014  . Encounter for school history and physical examination 06/18/2014    Patient's Medications  New Prescriptions   No medications on file  Previous Medications   AMPHETAMINE-DEXTROAMPHETAMINE (ADDERALL XR) 20 MG 24 HR CAPSULE    TAKE 1 CAPSULE BY MOUTH ONCE DAILY(02/07/20)   LORATADINE (CLARITIN) 10 MG TABLET    Take 10 mg by mouth daily. Reported on 01/24/2016   TERBINAFINE (LAMISIL) 250 MG TABLET    Take 1 tablet (250 mg total) by mouth daily.  Modified Medications   Modified Medication Previous Medication   EMTRICITABINE-RILPIVIR-TENOFOVIR AF (ODEFSEY) 200-25-25 MG TABS TABLET ODEFSEY 200-25-25 MG TABS tablet      Take 1 tablet by mouth daily.    TAKE 1 TABLET BY MOUTH DAILY.  Discontinued Medications   BENZONATATE (TESSALON) 200 MG CAPSULE    Take 1 capsule (200 mg total) by mouth 2 (two) times daily as needed for cough.   HYDROCODONE-ACETAMINOPHEN (NORCO/VICODIN) 5-325 MG TABLET       PROMETHAZINE-DEXTROMETHORPHAN (PROMETHAZINE-DM) 6.25-15 MG/5ML SYRUP    Take 5 mLs by mouth 4 (four) times daily as needed for cough.    Subjective: Kevin Keller is in for his routine HIV follow-up visit.  He denies any problems obtaining, taking or tolerating his Odefsey.  He never misses doses.  He has had both doses of his Covid vaccine.  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  He is concerned because he has gained weight during the  pandemic.  Review of Systems: Review of Systems  Constitutional: Negative for fever and weight loss.  Gastrointestinal: Positive for abdominal pain, heartburn and nausea. Negative for diarrhea and vomiting.    Past Medical History:  Diagnosis Date  . H/O seasonal allergies   . HIV (human immunodeficiency virus infection) (HCC)    Excellent long term control as of 07/2018 ID f/u (on [place]).  . Tobacco dependence in remission     Social History   Tobacco Use  . Smoking status: Former Smoker    Packs/day: 0.50    Years: 13.00    Pack years: 6.50    Types: Cigarettes  . Smokeless tobacco: Never Used  Vaping Use  . Vaping Use: Former  Substance Use Topics  . Alcohol use: Yes    Alcohol/week: 0.0 standard drinks    Comment: occasionally  . Drug use: No    Family History  Problem Relation Age of Onset  . Cancer Mother        breast  . Heart disease Father 20  . Cancer Paternal Aunt     No Known Allergies  Health Maintenance  Topic Date Due  . COVID-19 Vaccine (1) Never done  . INFLUENZA VACCINE  04/03/2020  . TETANUS/TDAP  08/03/2021  . Hepatitis C Screening  Completed  . HIV Screening  Completed    Objective:  Vitals:   04/04/20 0841  BP: (!) 146/81  Pulse: 57  Temp: 98.1 F (36.7  C)  TempSrc: Oral  SpO2: 98%  Weight: (!) 221 lb (100.2 kg)  Height: 5\' 7"  (1.702 m)   Body mass index is 34.61 kg/m.  Physical Exam Constitutional:      Comments: His weight is up 3 pounds over the past year.  Abdominal:     General: There is distension.     Palpations: Abdomen is soft.     Tenderness: There is abdominal tenderness.  Psychiatric:        Mood and Affect: Mood normal.     Lab Results Lab Results  Component Value Date   WBC 5.5 08/11/2019   HGB 15.7 08/11/2019   HCT 45.1 08/11/2019   MCV 96.0 08/11/2019   PLT 347 08/11/2019    Lab Results  Component Value Date   CREATININE 0.86 08/11/2019   BUN 16 08/11/2019   NA 140 08/11/2019   K 4.1  08/11/2019   CL 105 08/11/2019   CO2 28 08/11/2019    Lab Results  Component Value Date   ALT 29 08/11/2019   AST 18 08/11/2019   ALKPHOS 70 01/01/2017   BILITOT 0.4 08/11/2019    Lab Results  Component Value Date   CHOL 230 (H) 08/11/2019   HDL 41 08/11/2019   LDLCALC 150 (H) 08/11/2019   TRIG 244 (H) 08/11/2019   CHOLHDL 5.6 (H) 08/11/2019   Lab Results  Component Value Date   LABRPR NON-REACTIVE 08/11/2019   HIV 1 RNA Quant (copies/mL)  Date Value  03/23/2020 <20 NOT DETECTED  08/11/2019 <20 DETECTED (A)  01/22/2019 <20 NOT DETECTED   CD4 T Cell Abs (/uL)  Date Value  03/23/2020 629  08/11/2019 578  01/22/2019 485     Problem List Items Addressed This Visit      High   Human immunodeficiency virus (HIV) disease (HCC)    His infection remains under excellent, long-term control.  He will continue Odefsey and follow-up after lab work in 1 year.      Relevant Medications   emtricitabine-rilpivir-tenofovir AF (ODEFSEY) 200-25-25 MG TABS tablet   Other Relevant Orders   CBC   T-helper cell (CD4)- ([facility] clinic only)   Comprehensive metabolic panel   Lipid panel   RPR   HIV-1 RNA quant-no reflex-bld        11-03-1988, MD [facility] [phone] pager   [phone] cell 04/04/2020, 9:04 AM

## 2020-04-08 MED FILL — ODEFSEY 200-25-25 MG TABS: 200-25-25 | 30 days supply | Qty: 30 | Fill #2

## 2020-04-11 ENCOUNTER — Other Ambulatory Visit: Payer: Self-pay | Admitting: Internal Medicine

## 2020-04-11 DIAGNOSIS — F901 Attention-deficit hyperactivity disorder, predominantly hyperactive type: Secondary | ICD-10-CM

## 2020-04-11 MED FILL — ADDERALL XR 20 MG CAP SA: 20 | 30 days supply | Qty: 30 | Fill #0

## 2020-04-11 NOTE — Telephone Encounter (Signed)
Please contact patient to schedule an OV to sign controlled substance contract

## 2020-04-26 ENCOUNTER — Encounter: Payer: Self-pay | Admitting: Internal Medicine

## 2020-04-26 ENCOUNTER — Other Ambulatory Visit: Payer: Self-pay

## 2020-04-26 ENCOUNTER — Ambulatory Visit (INDEPENDENT_AMBULATORY_CARE_PROVIDER_SITE_OTHER): Payer: 59 | Admitting: Internal Medicine

## 2020-04-26 VITALS — BP 139/80 | HR 70 | Temp 98.2°F | Ht 67.0 in | Wt 224.3 lb

## 2020-04-26 DIAGNOSIS — F901 Attention-deficit hyperactivity disorder, predominantly hyperactive type: Secondary | ICD-10-CM

## 2020-04-26 NOTE — Progress Notes (Signed)
   CC: ADHD follow up  HPI:  [name] is a 39 y.o. with a PMHx as listed below who presents to the clinic for ADHD follow up.   Please see the Encounters tab for problem-based Assessment & Plan regarding status of patient's acute and chronic conditions.  Past Medical History:  Diagnosis Date  . H/O seasonal allergies   . HIV (human immunodeficiency virus infection) (HCC)    Excellent long term control as of 07/2018 ID f/u (on [place]).  . Tobacco dependence in remission    Review of Systems: Review of Systems  Constitutional: Negative for chills and fever.  Respiratory: Negative for shortness of breath.   Cardiovascular: Negative for chest pain and palpitations.  Neurological: Negative for dizziness and headaches.  Psychiatric/Behavioral: Negative for substance abuse. The patient is not nervous/anxious and does not have insomnia.    Physical Exam:  Vitals:   04/26/20 0924  BP: 139/80  Pulse: 70  Temp: 98.2 F (36.8 C)  TempSrc: Oral  SpO2: 98%  Weight: 224 lb 4.8 oz (101.7 kg)  Height: 5\' 7"  (1.702 m)   Physical Exam Vitals and nursing note reviewed.  Constitutional:      General: He is not in acute distress.    Appearance: He is obese.  Pulmonary:     Effort: Pulmonary effort is normal. No respiratory distress.  Skin:    General: Skin is warm and dry.  Neurological:     General: No focal deficit present.     Mental Status: He is alert and oriented to person, place, and time. Mental status is at baseline.  Psychiatric:        Mood and Affect: Mood normal.        Behavior: Behavior normal.        Thought Content: Thought content normal.        Judgment: Judgment normal.    Assessment & Plan:   See Encounters Tab for problem based charting.  Patient discussed with Dr. 

## 2020-04-27 NOTE — Assessment & Plan Note (Addendum)
Mr. Deemer states he has been doing well on his current medication regimen, which includes Adderall XR 20 mg.  He notes that this helped him tremendously with his schoolwork.  He denies any chest pain, palpitations, dizziness.  Assessment/plan: Patient is here today to fill out a controlled substance contract.  This was reviewed with him and patient was in agreement.  -Contract signed -Tox assure pending -Continue current medication regimen follow-up with PCP

## 2020-04-28 NOTE — Progress Notes (Signed)
Internal Medicine Clinic Attending  Case discussed with Dr. Basaraba  At the time of the visit.  We reviewed the resident's history and exam and pertinent patient test results.  I agree with the assessment, diagnosis, and plan of care documented in the resident's note.  

## 2020-04-29 LAB — TOXASSURE SELECT,+ANTIDEPR,UR

## 2020-05-09 MED FILL — ODEFSEY 200-25-25 MG TABS: 200-25-25 | 30 days supply | Qty: 30 | Fill #3

## 2020-05-10 ENCOUNTER — Other Ambulatory Visit: Payer: Self-pay | Admitting: Internal Medicine

## 2020-05-10 DIAGNOSIS — F901 Attention-deficit hyperactivity disorder, predominantly hyperactive type: Secondary | ICD-10-CM

## 2020-05-11 MED FILL — ADDERALL XR 20 MG CAP SA: 20 | 30 days supply | Qty: 30 | Fill #0

## 2020-05-16 ENCOUNTER — Other Ambulatory Visit: Payer: Self-pay | Admitting: Internal Medicine

## 2020-05-16 NOTE — Telephone Encounter (Signed)
Please refill or refuse; I do not have the authority to deny refill. Last refilled 05/11/20 per r Cyndie Chime. Thanks

## 2020-06-08 ENCOUNTER — Other Ambulatory Visit: Payer: Self-pay | Admitting: Internal Medicine

## 2020-06-08 ENCOUNTER — Other Ambulatory Visit: Payer: Self-pay | Admitting: Student

## 2020-06-08 DIAGNOSIS — F901 Attention-deficit hyperactivity disorder, predominantly hyperactive type: Secondary | ICD-10-CM

## 2020-06-08 MED FILL — ADDERALL XR 20 MG CAP SA: 20 | 30 days supply | Qty: 30 | Fill #0

## 2020-06-08 MED FILL — ODEFSEY 200-25-25 MG TABS: 200-25-25 | 30 days supply | Qty: 30 | Fill #4

## 2020-07-08 ENCOUNTER — Other Ambulatory Visit: Payer: Self-pay | Admitting: Internal Medicine

## 2020-07-08 DIAGNOSIS — F901 Attention-deficit hyperactivity disorder, predominantly hyperactive type: Secondary | ICD-10-CM

## 2020-07-08 MED FILL — ODEFSEY 200-25-25 MG TABS: 200-25-25 | 30 days supply | Qty: 30 | Fill #0

## 2020-07-08 MED FILL — ADDERALL XR 20 MG CAP SA: 20 | 30 days supply | Qty: 30 | Fill #0

## 2020-08-08 ENCOUNTER — Other Ambulatory Visit: Payer: Self-pay | Admitting: Internal Medicine

## 2020-08-08 ENCOUNTER — Telehealth: Payer: Self-pay | Admitting: Internal Medicine

## 2020-08-08 DIAGNOSIS — F901 Attention-deficit hyperactivity disorder, predominantly hyperactive type: Secondary | ICD-10-CM

## 2020-08-08 MED FILL — ADDERALL XR 20 MG CAP SA: 20 | 30 days supply | Qty: 30 | Fill #0

## 2020-08-08 MED FILL — ODEFSEY 200-25-25 MG TABS: 200-25-25 | 30 days supply | Qty: 30 | Fill #1

## 2020-08-08 NOTE — Telephone Encounter (Signed)
I was able to speak to Kevin Keller prior to refilling his Adderall.  His last tox assure showed undetectable Adderall and positive for THC.  Upon speaking to him, he states that during the past several months he was not in school and was thus not taking the Adderall however he is going back to school and will start taking the medicine.  I did give him a courtesy warning that prior to his next refill, we would do a repeat tox assure and if results come back in appropriate, we would cease prescribing Adderall and he expressed understanding.

## 2020-08-08 NOTE — Telephone Encounter (Signed)
I was able to speak to Kevin Keller prior to refilling his Adderall.  His last tox assure showed undetectable Adderall and positive for THC.  Upon speaking to him, he states that during the past several months he was not in school and was thus not taking the Adderall however he is going back to school and will start taking the medicine. ° °I did give him a courtesy warning that prior to his next refill, we would do a repeat tox assure and if results come back in appropriate, we would cease prescribing Adderall and he expressed understanding. °

## 2020-09-08 ENCOUNTER — Telehealth: Payer: Self-pay

## 2020-09-08 ENCOUNTER — Other Ambulatory Visit: Payer: Self-pay | Admitting: Internal Medicine

## 2020-09-08 DIAGNOSIS — F901 Attention-deficit hyperactivity disorder, predominantly hyperactive type: Secondary | ICD-10-CM

## 2020-09-08 MED FILL — ODEFSEY 200-25-25 MG TABS: 200-25-25 | 30 days supply | Qty: 30 | Fill #2

## 2020-09-08 NOTE — Telephone Encounter (Signed)
Discussed with patient that he would need to be seen in [facility] for urine tox prior to refilling his Adderall and that he would benefit from additional labwork. Informed him this would not be with Dr. Nedra Hai this month. Patient voiced understanding.   Glenford Bayley, PGY1 Internal Medicine [phone]

## 2020-09-08 NOTE — Telephone Encounter (Signed)
Returned call to patient, he states he is wanting to speak to Dr. Nedra Hai.  RN informed patient Dr. Nedra Hai is not in clinic this rotation.  Patient states "I don't know anything about Dr.'s rotating out of clinic.  He knows me and my situation well".  RN explained resident clinic rotation to patient and he verbalized understanding.  Pt states he has a lot of questions about getting a 2nd Covid booster, states he works at [employer] and has HIV.  RN encouraged patient to contact his ID physician.  Pt states he doesn't see ID that often and wants to speak to [facility] physician. Telehealth appt offered to patient, he cannot find a date that works for him.  Patient states he is too busy with work and school and doesn't want to miss his "work-out". RN suggested to patient he send questions via MyChart, since he will be unavailable for return calls, he is in agreement. SChaplin, RN,BSN

## 2020-09-08 NOTE — Telephone Encounter (Signed)
Requesting to speak with a nurse about something personal. Please call pt back. 

## 2020-09-15 ENCOUNTER — Encounter: Payer: Self-pay | Admitting: Student

## 2020-09-15 ENCOUNTER — Encounter: Payer: 59 | Admitting: Student

## 2020-09-15 ENCOUNTER — Ambulatory Visit (INDEPENDENT_AMBULATORY_CARE_PROVIDER_SITE_OTHER): Payer: 59 | Admitting: Student

## 2020-09-15 VITALS — BP 138/69 | HR 84 | Temp 98.1°F | Wt 216.5 lb

## 2020-09-15 DIAGNOSIS — E782 Mixed hyperlipidemia: Secondary | ICD-10-CM | POA: Diagnosis not present

## 2020-09-15 DIAGNOSIS — F901 Attention-deficit hyperactivity disorder, predominantly hyperactive type: Secondary | ICD-10-CM | POA: Diagnosis not present

## 2020-09-15 DIAGNOSIS — E785 Hyperlipidemia, unspecified: Secondary | ICD-10-CM

## 2020-09-15 DIAGNOSIS — Z130 Encounter for screening for diseases of the blood and blood-forming organs and certain disorders involving the immune mechanism: Secondary | ICD-10-CM | POA: Diagnosis not present

## 2020-09-15 DIAGNOSIS — B20 Human immunodeficiency virus [HIV] disease: Secondary | ICD-10-CM | POA: Diagnosis not present

## 2020-09-15 DIAGNOSIS — E6609 Other obesity due to excess calories: Secondary | ICD-10-CM

## 2020-09-15 DIAGNOSIS — Z6833 Body mass index (BMI) 33.0-33.9, adult: Secondary | ICD-10-CM | POA: Diagnosis not present

## 2020-09-15 DIAGNOSIS — R8290 Unspecified abnormal findings in urine: Secondary | ICD-10-CM | POA: Diagnosis not present

## 2020-09-15 DIAGNOSIS — E669 Obesity, unspecified: Secondary | ICD-10-CM | POA: Diagnosis not present

## 2020-09-15 LAB — POCT GLYCOSYLATED HEMOGLOBIN (HGB A1C): Hemoglobin A1C: 4.8 % (ref 4.0–5.6)

## 2020-09-15 LAB — GLUCOSE, CAPILLARY: Glucose-Capillary: 96 mg/dL (ref 70–99)

## 2020-09-15 NOTE — Progress Notes (Signed)
   CC: medication refill  HPI:  Kevin Keller is a 40 y.o. obese gentleman w/ PMHx very well-controlled HIV on [place], HLD, and ADHD on Adderall, presenting for urine drug screen to obtain refill of Adderall prescription. Please see problem-based assessment and plan for additional details.   Past Medical History:  Diagnosis Date  . H/O seasonal allergies   . HIV (human immunodeficiency virus infection) (HCC)    Excellent long term control as of 07/2018 ID f/u (on [place]).  . Tobacco dependence in remission    Review of Systems:  Positive for chronic anxiety and intentional weight loss. Negative for fevers, chills, cough.   Physical Exam:  Vitals:   09/15/20 1524  BP: 138/69  Pulse: 84  Temp: 98.1 F (36.7 C)  TempSrc: Oral  SpO2: 95%  Weight: 216 lb 8 oz (98.2 kg)   General: Patient is obese. He appears well, in no acute distress.  Eyes: Sclera non-icteric. No conjunctival injection.  Respiratory: Lungs are CTA, bilaterally. No wheezes, rales, or rhonchi.  Cardiovascular: Regular rate and rhythm. No murmurs, rubs, or gallops. No lower extremity edema. Abdominal: Soft and non-tender to palpation. No rebound or guarding. MSK: Normal muscle bulk and strength. Neurological: Alert and oriented x 3. Skin: No lesions. No rashes.  Psych: Patient appears anxious. Intact insight and judgement. Pleasant and cooperative.   Assessment & Plan:   See Encounters Tab for problem based charting.  Patient discussed with Dr. Sandre Kitty.  Glenford Bayley, MD 09/15/2020, 10:54 PM Pager: [phone]

## 2020-09-15 NOTE — Patient Instructions (Signed)
Kevin Keller,   Today, we checked a urine screen to be sure that you are adherent to your current medication. Adderall is a great medication and it sounds as though it has really helped you - I'm grateful for that!  Congratulations on the weight loss - keep up the good work!   Today, we will check your blood counts, kidney and liver function, electrolytes, hemoglobin A1c, and cholesterol levels.   I will call you with results when they are available.   I will refill your Adderall after your urine screening returns.   Please reach out to your ID doctor to discuss when you should get your next COVID-19 booster.   If you have any questions or concerns call at [phone]. Please call to schedule your follow up visit with Dr. Nedra Hai in 6 months.  Thank you and stay well!  Dr. Laddie Aquas    Calorie Counting for Weight Loss Calories are units of energy. Your body needs a certain number of calories from food to keep going throughout the day. When you eat or drink more calories than your body needs, your body stores the extra calories mostly as fat. When you eat or drink fewer calories than your body needs, your body burns fat to get the energy it needs. Calorie counting means keeping track of how many calories you eat and drink each day. Calorie counting can be helpful if you need to lose weight. If you eat fewer calories than your body needs, you should lose weight. Ask your health care provider what a healthy weight is for you. For calorie counting to work, you will need to eat the right number of calories each day to lose a healthy amount of weight per week. A dietitian can help you figure out how many calories you need in a day and will suggest ways to reach your calorie goal.  A healthy amount of weight to lose each week is usually 1-2 lb (0.5-0.9 kg). This usually means that your daily calorie intake should be reduced by 500-750 calories.  Eating 1,200-1,500 calories a day can help most women  lose weight.  Eating 1,500-1,800 calories a day can help most men lose weight. What do I need to know about calorie counting? Work with your health care provider or dietitian to determine how many calories you should get each day. To meet your daily calorie goal, you will need to:  Find out how many calories are in each food that you would like to eat. Try to do this before you eat.  Decide how much of the food you plan to eat.  Keep a food log. Do this by writing down what you ate and how many calories it had. To successfully lose weight, it is important to balance calorie counting with a healthy lifestyle that includes regular activity. Where do I find calorie information? The number of calories in a food can be found on a Nutrition Facts label. If a food does not have a Nutrition Facts label, try to look up the calories online or ask your dietitian for help. Remember that calories are listed per serving. If you choose to have more than one serving of a food, you will have to multiply the calories per serving by the number of servings you plan to eat. For example, the label on a package of bread might say that a serving size is 1 slice and that there are 90 calories in a serving. If you eat 1 slice, you will  have eaten 90 calories. If you eat 2 slices, you will have eaten 180 calories.   How do I keep a food log? After each time that you eat, record the following in your food log as soon as possible:  What you ate. Be sure to include toppings, sauces, and other extras on the food.  How much you ate. This can be measured in cups, ounces, or number of items.  How many calories were in each food and drink.  The total number of calories in the food you ate. Keep your food log near you, such as in a pocket-sized notebook or on an app or website on your mobile phone. Some programs will calculate calories for you and show you how many calories you have left to meet your daily goal. What are some  portion-control tips?  Know how many calories are in a serving. This will help you know how many servings you can have of a certain food.  Use a measuring cup to measure serving sizes. You could also try weighing out portions on a kitchen scale. With time, you will be able to estimate serving sizes for some foods.  Take time to put servings of different foods on your favorite plates or in your favorite bowls and cups so you know what a serving looks like.  Try not to eat straight from a food's packaging, such as from a bag or box. Eating straight from the package makes it hard to see how much you are eating and can lead to overeating. Put the amount you would like to eat in a cup or on a plate to make sure you are eating the right portion.  Use smaller plates, glasses, and bowls for smaller portions and to prevent overeating.  Try not to multitask. For example, avoid watching TV or using your computer while eating. If it is time to eat, sit down at a table and enjoy your food. This will help you recognize when you are full. It will also help you be more mindful of what and how much you are eating. What are tips for following this plan? Reading food labels  Check the calorie count compared with the serving size. The serving size may be smaller than what you are used to eating.  Check the source of the calories. Try to choose foods that are high in protein, fiber, and vitamins, and low in saturated fat, trans fat, and sodium. Shopping  Read nutrition labels while you shop. This will help you make healthy decisions about which foods to buy.  Pay attention to nutrition labels for low-fat or fat-free foods. These foods sometimes have the same number of calories or more calories than the full-fat versions. They also often have added sugar, starch, or salt to make up for flavor that was removed with the fat.  Make a grocery list of lower-calorie foods and stick to it. Cooking  Try to cook your  favorite foods in a healthier way. For example, try baking instead of frying.  Use low-fat dairy products. Meal planning  Use more fruits and vegetables. One-half of your plate should be fruits and vegetables.  Include lean proteins, such as chicken, [place], and fish. Lifestyle Each week, aim to do one of the following:  150 minutes of moderate exercise, such as walking.  75 minutes of vigorous exercise, such as running. General information  Know how many calories are in the foods you eat most often. This will help you calculate calorie  counts faster.  Find a way of tracking calories that works for you. Get creative. Try different apps or programs if writing down calories does not work for you. What foods should I eat?  Eat nutritious foods. It is better to have a nutritious, high-calorie food, such as an avocado, than a food with few nutrients, such as a bag of potato chips.  Use your calories on foods and drinks that will fill you up and will not leave you hungry soon after eating. ? Examples of foods that fill you up are nuts and nut butters, vegetables, lean proteins, and high-fiber foods such as whole grains. High-fiber foods are foods with more than 5 g of fiber per serving.  Pay attention to calories in drinks. Low-calorie drinks include water and unsweetened drinks. The items listed above may not be a complete list of foods and beverages you can eat. Contact a dietitian for more information.   What foods should I limit? Limit foods or drinks that are not good sources of vitamins, minerals, or protein or that are high in unhealthy fats. These include:  Candy.  Other sweets.  Sodas, specialty coffee drinks, alcohol, and juice. The items listed above may not be a complete list of foods and beverages you should avoid. Contact a dietitian for more information. How do I count calories when eating out?  Pay attention to portions. Often, portions are much larger when eating  out. Try these tips to keep portions smaller: ? Consider sharing a meal instead of getting your own. ? If you get your own meal, eat only half of it. Before you start eating, ask for a container and put half of your meal into it. ? When available, consider ordering smaller portions from the menu instead of full portions.  Pay attention to your food and drink choices. Knowing the way food is cooked and what is included with the meal can help you eat fewer calories. ? If calories are listed on the menu, choose the lower-calorie options. ? Choose dishes that include vegetables, fruits, whole grains, low-fat dairy products, and lean proteins. ? Choose items that are boiled, broiled, grilled, or steamed. Avoid items that are buttered, battered, fried, or served with cream sauce. Items labeled as crispy are usually fried, unless stated otherwise. ? Choose water, low-fat milk, unsweetened iced tea, or other drinks without added sugar. If you want an alcoholic beverage, choose a lower-calorie option, such as a glass of wine or light beer. ? Ask for dressings, sauces, and syrups on the side. These are usually high in calories, so you should limit the amount you eat. ? If you want a salad, choose a garden salad and ask for grilled meats. Avoid extra toppings such as bacon, cheese, or fried items. Ask for the dressing on the side, or ask for olive oil and vinegar or lemon to use as dressing.  Estimate how many servings of a food you are given. Knowing serving sizes will help you be aware of how much food you are eating at restaurants. Where to find more information  Centers for Disease Control and Prevention: FootballExhibition.com.brwww.cdc.gov  U.S. Department of Agriculture: WrestlingReporter.dkmyplate.gov Summary  Calorie counting means keeping track of how many calories you eat and drink each day. If you eat fewer calories than your body needs, you should lose weight.  A healthy amount of weight to lose per week is usually 1-2 lb (0.5-0.9 kg).  This usually means reducing your daily calorie intake by 500-750 calories.  The number of calories in a food can be found on a Nutrition Facts label. If a food does not have a Nutrition Facts label, try to look up the calories online or ask your dietitian for help.  Use smaller plates, glasses, and bowls for smaller portions and to prevent overeating.  Use your calories on foods and drinks that will fill you up and not leave you hungry shortly after a meal. This information is not intended to replace advice given to you by your health care provider. Make sure you discuss any questions you have with your health care provider. Document Revised: 10/01/2019 Document Reviewed: 10/01/2019 Elsevier Patient Education  2021 [employer].

## 2020-09-16 ENCOUNTER — Telehealth: Payer: Self-pay | Admitting: Student

## 2020-09-16 LAB — LIPID PANEL
Chol/HDL Ratio: 6.6 ratio — ABNORMAL HIGH (ref 0.0–5.0)
Cholesterol, Total: 236 mg/dL — ABNORMAL HIGH (ref 100–199)
HDL: 36 mg/dL — ABNORMAL LOW (ref >39)
LDL Chol Calc (NIH): 121 mg/dL — ABNORMAL HIGH (ref 0–99)
Triglycerides: 448 mg/dL — ABNORMAL HIGH (ref 0–149)
VLDL Cholesterol Cal: 79 mg/dL — ABNORMAL HIGH (ref 5–40)

## 2020-09-16 LAB — CBC
Hematocrit: 45.4 % (ref 37.5–51.0)
Hemoglobin: 15.9 g/dL (ref 13.0–17.7)
MCH: 33.3 pg — ABNORMAL HIGH (ref 26.6–33.0)
MCHC: 35 g/dL (ref 31.5–35.7)
MCV: 95 fL (ref 79–97)
Platelets: 283 10*3/uL (ref 150–450)
RBC: 4.78 x10E6/uL (ref 4.14–5.80)
RDW: 11.8 % (ref 11.6–15.4)
WBC: 6 10*3/uL (ref 3.4–10.8)

## 2020-09-16 LAB — CMP14 + ANION GAP
ALT: 24 IU/L (ref 0–44)
AST: 24 IU/L (ref 0–40)
Albumin/Globulin Ratio: 1.8 (ref 1.2–2.2)
Albumin: 4.6 g/dL (ref 4.0–5.0)
Alkaline Phosphatase: 74 IU/L (ref 44–121)
Anion Gap: 17 mmol/L (ref 10.0–18.0)
BUN/Creatinine Ratio: 22 — ABNORMAL HIGH (ref 9–20)
BUN: 21 mg/dL (ref 6–24)
Bilirubin Total: 0.2 mg/dL (ref 0.0–1.2)
CO2: 20 mmol/L (ref 20–29)
Calcium: 9.3 mg/dL (ref 8.7–10.2)
Chloride: 102 mmol/L (ref 96–106)
Creatinine, Ser: 0.95 mg/dL (ref 0.76–1.27)
GFR calc Af Amer: 115 mL/min/{1.73_m2} (ref >59)
GFR calc non Af Amer: 100 mL/min/{1.73_m2} (ref >59)
Globulin, Total: 2.5 g/dL (ref 1.5–4.5)
Glucose: 88 mg/dL (ref 65–99)
Potassium: 3.9 mmol/L (ref 3.5–5.2)
Sodium: 139 mmol/L (ref 134–144)
Total Protein: 7.1 g/dL (ref 6.0–8.5)

## 2020-09-16 NOTE — Assessment & Plan Note (Signed)
Patient has hx of ADHD since childhood. Trialed going off of Adderral in adulthood, although had difficulties with concentration. Was started back on this. States he takes this medication regularly although is due for refill. 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 However, he states this was a one-time occurrence and says he has not used any non-prescribed illicit drugs since. PHQ9 score = 0.   - Check urine toxicology prior to refilling Adderall

## 2020-09-16 NOTE — Assessment & Plan Note (Signed)
Patient states that he has lost a significant amount of weight over the last several months with improvements in diet and exercise. Actively working towards continued healthy weight loss.   - Continue to encourage healthy weight loss - Check Hb A1c

## 2020-09-16 NOTE — Assessment & Plan Note (Addendum)
Currently under great control with regular ID follow-up. Questions whether he should receive another COVID-19 booster. States his first 2 vaccinations were in 2020 followed by booster shot within the last 5 months.   - Requested patient speak with ID physician regarding appropriate Booster schedule for him - Check CBC - Check CMP

## 2020-09-16 NOTE — Telephone Encounter (Signed)
Spoke with patient regarding his lab results. Informed him that although his cholesterol and triglycerides are significantly elevated, his ASCVD risk score is only 2.8% and thus does not require statin therapy. He notes that he had [employer] and Brett Albino before his testing and has also recently started taking Keto supplements and Whey protein. He was cautioned on the use of these supplements and recommended to follow more mediterranean diet and avoid simple sugars and saturated/trans fats.   His A1c, CBC w/ diff and CMP returned unremarkable.   Will call him back with results from urine toxicology are available. He notes he is out of his adderall and starts school next week.   Glenford Bayley, PGY1 Internal Medicine [phone]

## 2020-09-16 NOTE — Assessment & Plan Note (Signed)
Had hyperlipidemia on last lipid panel > 1 year ago; however, has noted recent weight loss and has not had other CVD risk assessment such as Hb A1c performed.   - Repeat lipid panel - Check Hb A1c

## 2020-09-20 NOTE — Progress Notes (Signed)
Internal Medicine Clinic Attending  Case discussed with Dr. Speakman at the time of the visit.  We reviewed the resident's history and exam and pertinent patient test results.  I agree with the assessment, diagnosis, and plan of care documented in the resident's note.  Loys Shugars, M.D., Ph.D.  

## 2020-09-21 ENCOUNTER — Encounter: Payer: Self-pay | Admitting: Internal Medicine

## 2020-09-21 ENCOUNTER — Telehealth: Payer: Self-pay

## 2020-09-21 DIAGNOSIS — S134XXA Sprain of ligaments of cervical spine, initial encounter: Secondary | ICD-10-CM | POA: Diagnosis not present

## 2020-09-21 DIAGNOSIS — S29012A Strain of muscle and tendon of back wall of thorax, initial encounter: Secondary | ICD-10-CM | POA: Diagnosis not present

## 2020-09-21 DIAGNOSIS — S39012A Strain of muscle, fascia and tendon of lower back, initial encounter: Secondary | ICD-10-CM | POA: Diagnosis not present

## 2020-09-21 NOTE — Telephone Encounter (Signed)
Received TC from patient who is asking if his urine toxicology results are in yet?  Per chart review, test is not resulted yet.  RN informed patient this test can sometimes take a week to turn around.  He verbalized understanding and asked if RN would let Dr. Laddie Aquas know he called about his test results because he needs his adderall. SChaplin, RN,BSN

## 2020-09-21 NOTE — Telephone Encounter (Signed)
This RX request has already been forwarded to MD in another encounter.  This encounter closed. SChaplin, RN,BSN

## 2020-09-21 NOTE — Telephone Encounter (Signed)
Pt is requesting his amphetamine-dextroamphetamine (ADDERALL XR) 20 MG 24 hr capsule sent to  [facility] [place], [place] - [address] [place] Phone:  [phone]  Fax:  [phone]

## 2020-09-22 ENCOUNTER — Encounter: Payer: Self-pay | Admitting: Student

## 2020-09-22 ENCOUNTER — Other Ambulatory Visit: Payer: Self-pay | Admitting: Student

## 2020-09-22 DIAGNOSIS — F901 Attention-deficit hyperactivity disorder, predominantly hyperactive type: Secondary | ICD-10-CM

## 2020-09-22 LAB — TOXASSURE SELECT,+ANTIDEPR,UR

## 2020-09-22 NOTE — Telephone Encounter (Signed)
Patient was already informed that Dr. Laddie Aquas will call when she has the results. SChaplin, RN,BSN

## 2020-09-22 NOTE — Telephone Encounter (Signed)
Called patient and left voicemail informing him that his urine tox results were positive only for Adderall as expected. Refilled 30 day supply of Adderall to [facility] and instructed patient to call back if there are any issues picking this up.   Otherwise, instructed him to notify pharmacy to let us know when refills are due and to return to clinic for follow up appointment in 6 months.   Glenford Bayley, MD 09/22/2020, 6:15 PM Pager: [phone]

## 2020-09-23 MED FILL — ADDERALL XR 20 MG CAP SA: 20 | 30 days supply | Qty: 30 | Fill #0

## 2020-10-07 MED FILL — ODEFSEY 200-25-25 MG TABS: 200-25-25 | 30 days supply | Qty: 30 | Fill #3

## 2020-10-19 NOTE — Progress Notes (Signed)
[facility] [address] [place] [address] Phone: [phone] Subjective:   I Kevin Keller am serving as a Neurosurgeon for Dr. Antoine Primas.  This visit occurred during the SARS-CoV-2 public health emergency.  Safety protocols were in place, including screening questions prior to the visit, additional usage of staff PPE, and extensive cleaning of exam room while observing appropriate contact time as indicated for disinfecting solutions.   I'm seeing this patient by the request  of:  Theotis Barrio, MD  CC: Low back pain  [id number]  Kevin Keller is a 40 y.o. male coming in with complaint of back pain. Patient states he has lost about 30 lbs since August. States he has been eating an exercising better as well. States he believed he pulled something in his back about a week ago in his lower back. States he was also shortness of breath. Believes that it may have been muscle spasms. 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 States that he has a history of this back pain.   Onset- 1 week ago Location - worse on the right Duration- pain at night is the worse  Character- sore  Aggravating factors- sleeping, ADLs, working Reliving factors-  Therapies tried- E stim., oral medications, ibuprofen, exercises and stretches mainly core exercises, elliptical, treadmill  Severity- 8-9/10 at its worse       Past Medical History:  Diagnosis Date  . H/O seasonal allergies   . HIV (human immunodeficiency virus infection) (HCC)    Excellent long term control as of 07/2018 ID f/u (on [place]).  . Tobacco dependence in remission    No past surgical history on file. Social History   Socioeconomic History  . Marital status: Single    Spouse name: Not on file  . Number of children: 0  . Years of education: Not on file  . Highest education level: Not on file  Occupational History     Employer: [employer]  Tobacco Use  . Smoking status: Former Smoker    Packs/day: 0.50    Years: 13.00    Pack years: 6.50    Types: Cigarettes  . Smokeless tobacco: Never Used  Vaping Use  . Vaping Use: Former  Substance and Sexual Activity  . Alcohol use: Yes    Alcohol/week: 0.0 standard drinks    Comment: occasionally  . Drug use: No  . Sexual activity: Yes    Partners: Male    Birth control/protection: Condom    Comment: given condoms  Other Topics Concern  . Not on file  Social History Narrative   [name] works FT at [employer]. He is also a Theatre stage manager at [employer]      He lives w/2 roommates.   He recently re-located from [place].      Social Determinants of Health   Financial Resource Strain: Not on file  Food Insecurity: Not on file  Transportation Needs: Not on file  Physical Activity: Not on file  Stress: Not on file  Social Connections: Not on file   No Known Allergies Family History  Problem Relation Age of Onset  . Cancer Mother        breast  . Heart disease Father 21  . Cancer Paternal Aunt       Current Outpatient Medications (Respiratory):  .  loratadine (CLARITIN) 10 MG tablet, Take 10 mg by mouth daily. Reported on  01/24/2016    Current Outpatient Medications (Other):  .  amphetamine-dextroamphetamine (ADDERALL XR) 20 MG 24 hr capsule, TAKE 1 CAPSULE BY MOUTH ONCE A DAY .  cyclobenzaprine (FLEXERIL) 5 MG tablet, Take 1 tablet (5 mg total) by mouth at bedtime as needed for muscle spasms. Marland Kitchen  emtricitabine-rilpivir-tenofovir AF (ODEFSEY) 200-25-25 MG TABS tablet, Take 1 tablet by mouth daily. Marland Kitchen  terbinafine (LAMISIL) 250 MG tablet, Take 1 tablet (250 mg total) by mouth daily.   Reviewed prior external information including notes and imaging from  primary care provider As well as notes that were available from care everywhere and other healthcare systems.  Past medical history, social, surgical and family history all  reviewed in electronic medical record.  No pertanent information unless stated regarding to the chief complaint.   Review of Systems:  No headache, visual changes, nausea, vomiting, diarrhea, constipation, dizziness, abdominal pain, skin rash, fevers, chills, night sweats, weight loss, swollen lymph nodes, body aches, joint swelling, chest pain, shortness of breath, mood changes. POSITIVE muscle aches  Objective  Blood pressure 120/86, pulse 75, height 5\' 7"  (1.702 m), weight 214 lb (97.1 kg), SpO2 93 %.   General: No apparent distress alert and oriented x3 mood and affect normal, dressed appropriately.  HEENT: Pupils equal, extraocular movements intact  Respiratory: Patient's speak in full sentences and does not appear short of breath  Cardiovascular: No lower extremity edema, non tender, no erythema  Gait normal with good balance and coordination.  MSK: Back exam shows the patient does have some mild loss of lordosis.  Patient has a lipoma noted in the thoracolumbar juncture but nontender.  Patient does have some tenderness in the paraspinal musculature on the right side of the back.  Negative straight leg test.  Mild tightness with FABER test on the right.  Near full range of motion noted of the back.  Patient though does have a umbilical hernia that patient states he has had for years.  [id number]; 15 additional minutes spent for Therapeutic exercises as stated in above notes.  This included exercises focusing on stretching, strengthening, with significant focus on eccentric aspects.   Long term goals include an improvement in range of motion, strength, endurance as well as avoiding reinjury. Patient's frequency would include in 1-2 times a day, 3-5 times a week for a duration of 6-12 weeks. Low back exercises that included:  Pelvic tilt/bracing instruction to focus on control of the pelvic girdle and lower abdominal muscles  Glute strengthening exercises, focusing on proper firing of the glutes  without engaging the low back muscles Proper stretching techniques for maximum relief for the hamstrings, hip flexors, low back and some rotation where tolerated    Proper technique shown and discussed handout in great detail with ATC.  All questions were discussed and answered.      Impression and Recommendations:     The above documentation has been reviewed and is accurate and complete , DO

## 2020-10-20 ENCOUNTER — Ambulatory Visit: Payer: 59 | Admitting: Family Medicine

## 2020-10-20 ENCOUNTER — Other Ambulatory Visit: Payer: Self-pay | Admitting: Internal Medicine

## 2020-10-20 ENCOUNTER — Ambulatory Visit (INDEPENDENT_AMBULATORY_CARE_PROVIDER_SITE_OTHER): Payer: 59

## 2020-10-20 ENCOUNTER — Other Ambulatory Visit: Payer: Self-pay

## 2020-10-20 ENCOUNTER — Other Ambulatory Visit (HOSPITAL_COMMUNITY): Payer: Self-pay | Admitting: Family Medicine

## 2020-10-20 ENCOUNTER — Encounter: Payer: Self-pay | Admitting: Family Medicine

## 2020-10-20 ENCOUNTER — Other Ambulatory Visit: Payer: Self-pay | Admitting: Student

## 2020-10-20 VITALS — BP 120/86 | HR 75 | Ht 67.0 in | Wt 214.0 lb

## 2020-10-20 DIAGNOSIS — M545 Low back pain, unspecified: Secondary | ICD-10-CM | POA: Diagnosis not present

## 2020-10-20 DIAGNOSIS — G8929 Other chronic pain: Secondary | ICD-10-CM

## 2020-10-20 DIAGNOSIS — K429 Umbilical hernia without obstruction or gangrene: Secondary | ICD-10-CM

## 2020-10-20 DIAGNOSIS — F901 Attention-deficit hyperactivity disorder, predominantly hyperactive type: Secondary | ICD-10-CM

## 2020-10-20 MED ORDER — CYCLOBENZAPRINE HCL 5 MG PO TABS: 5.0000 mg | ORAL_TABLET | Freq: Every evening | ORAL | 1 refills | Status: DC | PRN

## 2020-10-20 MED FILL — CYCLOBENZAPRINE HCL 5 MG TA: 5 | 30 days supply | Qty: 30 | Fill #0

## 2020-10-20 NOTE — Assessment & Plan Note (Signed)
Patient has been working out but I do think some of the diet choices have shown patient to have more protein deficiency that could be contributing a little bit.  We discussed with patient about muscle relaxer to give at night but only use as needed.  We discussed core strengthening and more isometric exercises secondary to patient's umbilical hernia.  X-rays are ordered today to further evaluate.  Patient does have a mild lipoma noted of the left side of the back but the pain is on the right side that seems to be more muscular related.  No radicular symptoms.  Patient work with Event organiser today.  Follow-up with me again in 4 weeks.  Could be a candidate for osteopathic manipulation

## 2020-10-20 NOTE — Patient Instructions (Addendum)
Good to see you Lumbar xray Eat within 30 mins of working out [email or web address] goal is 70 grams of protein a day watch for 3 days Consider changing ab workout to do more planks of 30 seconds instead of crunches and situps  Exercise 3 times a week with the exercises at the end of workout Ice is better than heat See me again in 5-6 week

## 2020-10-20 NOTE — Assessment & Plan Note (Signed)
Small umbilical hernia.  Discussed changing some of the exercises.  Patient knows the signs and symptoms and what to watch out for for any type of incarceration.

## 2020-10-21 MED FILL — ADDERALL XR 20 MG CAP SA: 20 | 30 days supply | Qty: 30 | Fill #0

## 2020-10-31 MED FILL — ODEFSEY 200-25-25 MG TABS: 200-25-25 | 30 days supply | Qty: 30 | Fill #4

## 2020-11-21 ENCOUNTER — Other Ambulatory Visit: Payer: Self-pay | Admitting: Internal Medicine

## 2020-11-21 DIAGNOSIS — F901 Attention-deficit hyperactivity disorder, predominantly hyperactive type: Secondary | ICD-10-CM

## 2020-11-23 ENCOUNTER — Other Ambulatory Visit: Payer: Self-pay | Admitting: Internal Medicine

## 2020-11-23 DIAGNOSIS — F901 Attention-deficit hyperactivity disorder, predominantly hyperactive type: Secondary | ICD-10-CM

## 2020-11-23 NOTE — Progress Notes (Signed)
[facility] [address] [place] [address] Phone: [phone] Subjective:   I Kevin Keller am serving as a Neurosurgeon for Dr. Antoine Primas.  This visit occurred during the SARS-CoV-2 public health emergency.  Safety protocols were in place, including screening questions prior to the visit, additional usage of staff PPE, and extensive cleaning of exam room while observing appropriate contact time as indicated for disinfecting solutions.   I'm seeing this patient by the request  of:  Theotis Barrio, MD  CC: Back pain follow-up  [id number]   10/20/2020 Patient has been working out but I do think some of the diet choices have shown patient to have more protein deficiency that could be contributing a little bit.  We discussed with patient about muscle relaxer to give at night but only use as needed.  We discussed core strengthening and more isometric exercises secondary to patient's umbilical hernia.  X-rays are ordered today to further evaluate.  Patient does have a mild lipoma noted of the left side of the back but the pain is on the right side that seems to be more muscular related.  No radicular symptoms.  Patient work with Event organiser today.  Follow-up with me again in 4 weeks.  Could be a candidate for osteopathic manipulation  Small umbilical hernia.  Discussed changing some of the exercises.  Patient knows the signs and symptoms and what to watch out for for any type of incarceration.  Update 11/24/2020 Kevin Keller is a 40 y.o. male coming in with complaint of umbilical hernia and low back pain. Patient states he has been doing well. Haven't had any muscle spasms since the last visit. Muscle relaxer is working well. States if he has a bad day at work he takes the Academic librarian. Hasn't worked out in 2 weeks because he wants to give his body time to relax before he starts back up.  Patient is concerned still about the pain on his back.  Patient does  have HIV and wants to make sure this is not related to it. Patient still has some difficulty with the umbilical hernia but more secondary to vanity.  Patient does not have any pain and completely is still able to push it back in with no significant discomfort.     Past Medical History:  Diagnosis Date   H/O seasonal allergies    HIV (human immunodeficiency virus infection) (HCC)    Excellent long term control as of 07/2018 ID f/u (on O[place]).   Tobacco dependence in remission    No past surgical history on file. Social History   Socioeconomic History   Marital status: Single    Spouse name: Not on file   Number of children: 0   Years of education: Not on file   Highest education level: Not on file  Occupational History    Employer: [employer]  Tobacco Use   Smoking status: Former Smoker    Packs/day: 0.50    Years: 13.00    Pack years: 6.50    Types: Cigarettes   Smokeless tobacco: Never Used  Building services engineer Use: Former  Substance and Sexual Activity   Alcohol use: Yes    Alcohol/week: 0.0 standard drinks    Comment: occasionally   Drug use: No   Sexual activity: Yes    Partners: Male    Birth control/protection: Condom    Comment: given condoms  Other Topics Concern   Not on file  Social History Narrative   Mr. Caras works FT at [employer]. He is also a Theatre stage manager at [employer].      He lives w/2 roommates.   He recently re-located from [place].      Social Determinants of Health   Financial Resource Strain: Not on file  Food Insecurity: Not on file  Transportation Needs: Not on file  Physical Activity: Not on file  Stress: Not on file  Social Connections: Not on file   No Known Allergies Family History  Problem Relation Age of Onset   Cancer Mother        breast   Heart disease Father 59   Cancer Paternal Aunt       Current Outpatient Medications (Respiratory):    loratadine (CLARITIN) 10 MG tablet, Take  10 mg by mouth daily. Reported on 01/24/2016    Current Outpatient Medications (Other):    amphetamine-dextroamphetamine (ADDERALL XR) 20 MG 24 hr capsule, TAKE 1 CAPSULE BY MOUTH ONCE A DAY   emtricitabine-rilpivir-tenofovir AF (ODEFSEY) 200-25-25 MG TABS tablet, Take 1 tablet by mouth daily.   terbinafine (LAMISIL) 250 MG tablet, Take 1 tablet (250 mg total) by mouth daily.   cyclobenzaprine (FLEXERIL) 5 MG tablet, Take 1 tablet (5 mg total) by mouth at bedtime as needed for muscle spasms.   Reviewed prior external information including notes and imaging from  primary care provider As well as notes that were available from care everywhere and other healthcare systems.  Past medical history, social, surgical and family history all reviewed in electronic medical record.  No pertanent information unless stated regarding to the chief complaint.   Review of Systems:  No headache, visual changes, nausea, vomiting, diarrhea, constipation, dizziness, abdominal pain, skin rash, fevers, chills, night sweats, weight loss, swollen lymph nodes, body aches, joint swelling, chest pain, shortness of breath, mood changes. POSITIVE muscle aches  Objective  Blood pressure 140/90, pulse 77, height 5\' 7"  (1.702 m), weight 215 lb (97.5 kg), SpO2 96 %.   General: No apparent distress alert and oriented x3 mood and affect normal, dressed appropriately.  HEENT: Pupils equal, extraocular movements intact  Respiratory: Patient's speak in full sentences and does not appear short of breath  Cardiovascular: No lower extremity edema, non tender, no erythema  Gait normal with good balance and coordination.  MSK: Patient back exam with very minimal tenderness in the thoracolumbar juncture.  Patient does have scoliosis.  No pain in the lower back at the moment today.  Patient still has what appears to be a lipoma in the mid back more on the left side.  Mild tenderness to palpation. Umbilical hernia still noted as  well but nontender.   Impression and Recommendations:     The above documentation has been reviewed and is accurate and complete , DO  Total time with patient reviewing patient's imaging, talking about all his problems greater than 35 minutes

## 2020-11-24 ENCOUNTER — Other Ambulatory Visit: Payer: Self-pay

## 2020-11-24 ENCOUNTER — Other Ambulatory Visit (HOSPITAL_COMMUNITY): Payer: Self-pay | Admitting: Family Medicine

## 2020-11-24 ENCOUNTER — Ambulatory Visit: Payer: 59 | Admitting: Family Medicine

## 2020-11-24 ENCOUNTER — Encounter: Payer: Self-pay | Admitting: Family Medicine

## 2020-11-24 ENCOUNTER — Encounter: Payer: Self-pay | Admitting: Internal Medicine

## 2020-11-24 DIAGNOSIS — R222 Localized swelling, mass and lump, trunk: Secondary | ICD-10-CM | POA: Diagnosis not present

## 2020-11-24 DIAGNOSIS — M545 Low back pain, unspecified: Secondary | ICD-10-CM

## 2020-11-24 DIAGNOSIS — K429 Umbilical hernia without obstruction or gangrene: Secondary | ICD-10-CM | POA: Diagnosis not present

## 2020-11-24 DIAGNOSIS — G8929 Other chronic pain: Secondary | ICD-10-CM | POA: Diagnosis not present

## 2020-11-24 MED ORDER — CYCLOBENZAPRINE HCL 5 MG PO TABS: 5.0000 mg | ORAL_TABLET | Freq: Every evening | ORAL | 1 refills | Status: DC | PRN

## 2020-11-24 MED FILL — CYCLOBENZAPRINE HCL 5 MG TA: 5 | 30 days supply | Qty: 30 | Fill #0

## 2020-11-24 NOTE — Assessment & Plan Note (Signed)
Stable overall  Discussed HEP  Discussed worsening symptoms and what to do  Does not want surgery

## 2020-11-24 NOTE — Assessment & Plan Note (Addendum)
I do believe though that some of this is secondary to the scoliosis.  Patient does have a subcutaneous mass on the back.  I do feel that it is secondary to a very large lipoma.  We discussed the potential for other advanced imaging such as an MRI to further evaluate but with patient doing well I do not think so.  Patient is concerned because of his underlying HIV.  Do not think this is a sarcoma with patient being very healthy at this time and not having any difficulties with anything and patient been very well controlled with undetectable viral loads.  Encouraged him to check with infectious disease if they felt that anything else would not be necessary at this time.  Follow-up with me otherwise in 2 to 3 months

## 2020-11-24 NOTE — Assessment & Plan Note (Signed)
Patient has made some progress at this time.  Discussed which activities to do which wants to avoid.  Patient is not having as much pain but will refill of the muscle relaxer so he does have some on as-needed basis.  Discussed avoiding certain activities.  Follow-up with me again in 2 to 3 months.

## 2020-11-24 NOTE — Patient Instructions (Signed)
Good to see you [facility] Lets watch the back mass and you can discuss with infectious disease  It is likely a lypoma See me again in 3 months if continuing consider MRI Refilled muscle relaxer

## 2020-11-25 ENCOUNTER — Other Ambulatory Visit: Payer: Self-pay | Admitting: Student

## 2020-11-25 MED FILL — ADDERALL XR 20 MG CAP SA: 20 | 30 days supply | Qty: 30 | Fill #0

## 2020-11-28 MED FILL — ODEFSEY 200-25-25 MG TABS: 200-25-25 | 30 days supply | Qty: 30 | Fill #5

## 2020-11-29 ENCOUNTER — Other Ambulatory Visit (HOSPITAL_COMMUNITY): Payer: Self-pay

## 2020-12-19 ENCOUNTER — Other Ambulatory Visit (HOSPITAL_COMMUNITY): Payer: Self-pay

## 2020-12-21 ENCOUNTER — Other Ambulatory Visit (HOSPITAL_COMMUNITY): Payer: Self-pay

## 2020-12-21 MED FILL — Emtricitabine-Rilpivirine-Tenofovir AF Tab 200-25-25 MG: ORAL | 30 days supply | Qty: 30 | Fill #0 | Status: AC

## 2020-12-22 ENCOUNTER — Other Ambulatory Visit (HOSPITAL_COMMUNITY): Payer: Self-pay

## 2020-12-30 ENCOUNTER — Other Ambulatory Visit (HOSPITAL_COMMUNITY): Payer: Self-pay

## 2020-12-30 ENCOUNTER — Other Ambulatory Visit: Payer: Self-pay | Admitting: Student

## 2020-12-30 DIAGNOSIS — F901 Attention-deficit hyperactivity disorder, predominantly hyperactive type: Secondary | ICD-10-CM

## 2020-12-30 MED ORDER — AMPHETAMINE-DEXTROAMPHET ER 20 MG PO CP24
ORAL_CAPSULE | Freq: Every day | ORAL | 0 refills | Status: DC
  Filled 2020-12-30: qty 30, 30d supply, fill #0

## 2021-01-21 ENCOUNTER — Encounter: Payer: Self-pay | Admitting: *Deleted

## 2021-01-26 ENCOUNTER — Other Ambulatory Visit (HOSPITAL_COMMUNITY): Payer: Self-pay

## 2021-01-26 MED FILL — Emtricitabine-Rilpivirine-Tenofovir AF Tab 200-25-25 MG: ORAL | 30 days supply | Qty: 30 | Fill #1 | Status: AC

## 2021-01-31 ENCOUNTER — Other Ambulatory Visit (HOSPITAL_COMMUNITY): Payer: Self-pay

## 2021-01-31 MED FILL — Cyclobenzaprine HCl Tab 5 MG: ORAL | 30 days supply | Qty: 30 | Fill #0 | Status: AC

## 2021-02-01 ENCOUNTER — Other Ambulatory Visit (HOSPITAL_COMMUNITY): Payer: Self-pay

## 2021-02-01 ENCOUNTER — Other Ambulatory Visit: Payer: Self-pay | Admitting: Internal Medicine

## 2021-02-01 DIAGNOSIS — F901 Attention-deficit hyperactivity disorder, predominantly hyperactive type: Secondary | ICD-10-CM

## 2021-02-02 ENCOUNTER — Other Ambulatory Visit (HOSPITAL_COMMUNITY): Payer: Self-pay

## 2021-02-02 MED ORDER — AMPHETAMINE-DEXTROAMPHET ER 20 MG PO CP24
ORAL_CAPSULE | Freq: Every day | ORAL | 0 refills | Status: DC
  Filled 2021-02-02: qty 30, 30d supply, fill #0

## 2021-02-17 NOTE — Progress Notes (Deleted)
Encounter opened in error

## 2021-02-23 ENCOUNTER — Other Ambulatory Visit (HOSPITAL_COMMUNITY): Payer: Self-pay

## 2021-02-24 ENCOUNTER — Ambulatory Visit: Payer: 59 | Admitting: Family Medicine

## 2021-02-27 ENCOUNTER — Other Ambulatory Visit (HOSPITAL_COMMUNITY): Payer: Self-pay

## 2021-02-27 MED FILL — Emtricitabine-Rilpivirine-Tenofovir AF Tab 200-25-25 MG: ORAL | 30 days supply | Qty: 30 | Fill #2 | Status: AC

## 2021-03-07 ENCOUNTER — Encounter: Payer: Self-pay | Admitting: *Deleted

## 2021-03-22 ENCOUNTER — Other Ambulatory Visit (HOSPITAL_COMMUNITY): Payer: Self-pay

## 2021-03-22 MED FILL — Emtricitabine-Rilpivirine-Tenofovir AF Tab 200-25-25 MG: ORAL | 30 days supply | Qty: 30 | Fill #3 | Status: AC

## 2021-03-29 ENCOUNTER — Other Ambulatory Visit (HOSPITAL_COMMUNITY): Payer: Self-pay

## 2021-04-04 ENCOUNTER — Other Ambulatory Visit: Payer: 59

## 2021-04-19 ENCOUNTER — Encounter: Payer: 59 | Admitting: Internal Medicine

## 2021-04-19 DIAGNOSIS — S39012A Strain of muscle, fascia and tendon of lower back, initial encounter: Secondary | ICD-10-CM | POA: Diagnosis not present

## 2021-04-19 DIAGNOSIS — S29012A Strain of muscle and tendon of back wall of thorax, initial encounter: Secondary | ICD-10-CM | POA: Diagnosis not present

## 2021-04-19 DIAGNOSIS — S134XXA Sprain of ligaments of cervical spine, initial encounter: Secondary | ICD-10-CM | POA: Diagnosis not present

## 2021-04-20 ENCOUNTER — Other Ambulatory Visit: Payer: Self-pay | Admitting: Internal Medicine

## 2021-04-20 ENCOUNTER — Other Ambulatory Visit (HOSPITAL_COMMUNITY): Payer: Self-pay

## 2021-04-20 DIAGNOSIS — B20 Human immunodeficiency virus [HIV] disease: Secondary | ICD-10-CM

## 2021-04-20 MED ORDER — ODEFSEY 200-25-25 MG PO TABS
1.0000 | ORAL_TABLET | Freq: Every day | ORAL | 0 refills | Status: DC
  Filled 2021-04-20: qty 30, 30d supply, fill #0

## 2021-04-26 ENCOUNTER — Other Ambulatory Visit: Payer: Self-pay

## 2021-04-26 DIAGNOSIS — Z79899 Other long term (current) drug therapy: Secondary | ICD-10-CM

## 2021-04-26 DIAGNOSIS — B20 Human immunodeficiency virus [HIV] disease: Secondary | ICD-10-CM

## 2021-04-26 DIAGNOSIS — Z113 Encounter for screening for infections with a predominantly sexual mode of transmission: Secondary | ICD-10-CM

## 2021-05-01 ENCOUNTER — Other Ambulatory Visit: Payer: 59

## 2021-05-01 ENCOUNTER — Other Ambulatory Visit: Payer: Self-pay

## 2021-05-01 DIAGNOSIS — Z79899 Other long term (current) drug therapy: Secondary | ICD-10-CM

## 2021-05-01 DIAGNOSIS — Z113 Encounter for screening for infections with a predominantly sexual mode of transmission: Secondary | ICD-10-CM

## 2021-05-01 DIAGNOSIS — B20 Human immunodeficiency virus [HIV] disease: Secondary | ICD-10-CM

## 2021-05-03 LAB — COMPREHENSIVE METABOLIC PANEL
AG Ratio: 1.4 (calc) (ref 1.0–2.5)
ALT: 28 U/L (ref 9–46)
AST: 22 U/L (ref 10–40)
Albumin: 4.6 g/dL (ref 3.6–5.1)
Alkaline phosphatase (APISO): 66 U/L (ref 36–130)
BUN: 22 mg/dL (ref 7–25)
CO2: 27 mmol/L (ref 20–32)
Calcium: 9.9 mg/dL (ref 8.6–10.3)
Chloride: 103 mmol/L (ref 98–110)
Creat: 0.96 mg/dL (ref 0.60–1.29)
Globulin: 3.2 g/dL (calc) (ref 1.9–3.7)
Glucose, Bld: 88 mg/dL (ref 65–99)
Potassium: 4.2 mmol/L (ref 3.5–5.3)
Sodium: 138 mmol/L (ref 135–146)
Total Bilirubin: 0.6 mg/dL (ref 0.2–1.2)
Total Protein: 7.8 g/dL (ref 6.1–8.1)

## 2021-05-03 LAB — CBC WITH DIFFERENTIAL/PLATELET
Absolute Monocytes: 661 cells/uL (ref 200–950)
Basophils Absolute: 41 cells/uL (ref 0–200)
Basophils Relative: 0.7 %
Eosinophils Absolute: 70 cells/uL (ref 15–500)
Eosinophils Relative: 1.2 %
HCT: 49.4 % (ref 38.5–50.0)
Hemoglobin: 16.3 g/dL (ref 13.2–17.1)
Lymphs Abs: 2216 cells/uL (ref 850–3900)
MCH: 32.4 pg (ref 27.0–33.0)
MCHC: 33 g/dL (ref 32.0–36.0)
MCV: 98.2 fL (ref 80.0–100.0)
MPV: 10.2 fL (ref 7.5–12.5)
Monocytes Relative: 11.4 %
Neutro Abs: 2813 cells/uL (ref 1500–7800)
Neutrophils Relative %: 48.5 %
Platelets: 293 10*3/uL (ref 140–400)
RBC: 5.03 10*6/uL (ref 4.20–5.80)
RDW: 12 % (ref 11.0–15.0)
Total Lymphocyte: 38.2 %
WBC: 5.8 10*3/uL (ref 3.8–10.8)

## 2021-05-03 LAB — LIPID PANEL
Cholesterol: 231 mg/dL — ABNORMAL HIGH (ref <200)
HDL: 40 mg/dL (ref >=40)
LDL Cholesterol (Calc): 138 mg/dL (calc) — ABNORMAL HIGH
Non-HDL Cholesterol (Calc): 191 mg/dL (calc) — ABNORMAL HIGH (ref <130)
Total CHOL/HDL Ratio: 5.8 (calc) — ABNORMAL HIGH (ref <5.0)
Triglycerides: 345 mg/dL — ABNORMAL HIGH (ref <150)

## 2021-05-03 LAB — RPR: RPR Ser Ql: NONREACTIVE

## 2021-05-03 LAB — HIV-1 RNA QUANT-NO REFLEX-BLD
HIV 1 RNA Quant: <20 Copies/mL — ABNORMAL HIGH
HIV-1 RNA Quant, Log: <1.3 Log cps/mL — ABNORMAL HIGH

## 2021-05-03 LAB — T-HELPER CELL (CD4) - (RCID CLINIC ONLY)
CD4 % Helper T Cell: 31 % — ABNORMAL LOW (ref 33–65)
CD4 T Cell Abs: 649 /uL (ref 400–1790)

## 2021-05-15 DIAGNOSIS — S134XXA Sprain of ligaments of cervical spine, initial encounter: Secondary | ICD-10-CM | POA: Diagnosis not present

## 2021-05-15 DIAGNOSIS — S39012A Strain of muscle, fascia and tendon of lower back, initial encounter: Secondary | ICD-10-CM | POA: Diagnosis not present

## 2021-05-15 DIAGNOSIS — S29012A Strain of muscle and tendon of back wall of thorax, initial encounter: Secondary | ICD-10-CM | POA: Diagnosis not present

## 2021-05-16 ENCOUNTER — Encounter: Payer: Self-pay | Admitting: Internal Medicine

## 2021-05-16 ENCOUNTER — Ambulatory Visit (INDEPENDENT_AMBULATORY_CARE_PROVIDER_SITE_OTHER): Payer: 59 | Admitting: Internal Medicine

## 2021-05-16 ENCOUNTER — Other Ambulatory Visit: Payer: Self-pay

## 2021-05-16 DIAGNOSIS — B20 Human immunodeficiency virus [HIV] disease: Secondary | ICD-10-CM

## 2021-05-16 NOTE — Progress Notes (Signed)
Patient Active Problem List   Diagnosis Date Noted   Human immunodeficiency virus (HIV) disease (HCC) 05/07/2014    Priority: High   Obesity 01/17/2017    Priority: Medium   Mass of subcutaneous tissue of back 11/24/2020   Right low back pain 10/20/2020   Attention deficit hyperactivity disorder (ADHD), predominantly hyperactive type 10/07/2019   Scrotal swelling 10/07/2019   Umbilical hernia 11/10/2018   Tinea cruris 11/20/2017   Dyslipidemia 01/24/2016   History of genital warts 07/06/2014   History of chlamydia infection 07/06/2014   H/O seasonal allergies 07/05/2014    Patient's Medications  New Prescriptions   No medications on file  Previous Medications   AMPHETAMINE-DEXTROAMPHETAMINE (ADDERALL XR) 20 MG 24 HR CAPSULE    TAKE 1 CAPSULE BY MOUTH ONCE A DAY   CYCLOBENZAPRINE (FLEXERIL) 5 MG TABLET    Take 1 tablet (5 mg total) by mouth at bedtime as needed for muscle spasms.   CYCLOBENZAPRINE (FLEXERIL) 5 MG TABLET    TAKE 1 TABLET (5 MG TOTAL) BY MOUTH AT BEDTIME AS NEEDED FOR MUSCLE SPASMS.   CYCLOBENZAPRINE (FLEXERIL) 5 MG TABLET    TAKE 1 TABLET (5 MG TOTAL) BY MOUTH AT BEDTIME AS NEEDED FOR MUSCLE SPASMS.   EMTRICITABINE-RILPIVIR-TENOFOVIR AF (ODEFSEY) 200-25-25 MG TABS TABLET    TAKE 1 TABLET BY MOUTH DAILY.   LORATADINE (CLARITIN) 10 MG TABLET    Take 10 mg by mouth daily. Reported on 01/24/2016   TERBINAFINE (LAMISIL) 250 MG TABLET    Take 1 tablet (250 mg total) by mouth daily.  Modified Medications   No medications on file  Discontinued Medications   No medications on file    Subjective: Kevin Keller is in for his routine HIV follow-up visit.  He has not had any problems obtaining, taking or tolerating his Odefsey.  He has made multiple lifestyle changes with his diet and exercise routine and has lost about 30 pounds.  He has been worried about his cholesterol and triglyceride.  He says that his PCP in the internal medicine clinic graduated and he has not  gotten a new PCP yet.  He has not had a COVID booster since late last year.  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  He received his first monkeypox vaccine 2 weeks ago and is scheduled to get his second dose tomorrow.  Review of Systems: Review of Systems  Constitutional:  Positive for weight loss. Negative for fever.   Past Medical History:  Diagnosis Date   H/O seasonal allergies    HIV (human immunodeficiency virus infection) (HCC)    Excellent long term control as of 07/2018 ID f/u (on Odefsey).   Tobacco dependence in remission     Social History   Tobacco Use   Smoking status: Former    Packs/day: 0.50    Years: 13.00    Pack years: 6.50    Types: Cigarettes   Smokeless tobacco: Never  Vaping Use   Vaping Use: Former  Substance Use Topics   Alcohol use: Yes    Alcohol/week: 0.0 standard drinks    Comment: occasionally   Drug use: No    Family History  Problem Relation Age of Onset   Cancer Mother        breast   Heart disease Father 57   Cancer Paternal Aunt     No Known Allergies  Health Maintenance  Topic Date Due  COVID-19 Vaccine (3 - Pfizer risk series) 10/21/2019   INFLUENZA VACCINE  04/03/2021   TETANUS/TDAP  08/03/2021   Pneumococcal Vaccine 64-33 Years old (4 - PPSV23 or PCV20) 02/23/2045   Hepatitis C Screening  Completed   HIV Screening  Completed   HPV VACCINES  Aged Out    Objective:  Vitals:   05/16/21 1553  BP: (!) 143/87  Pulse: 86  Temp: 98.7 F (37.1 C)  TempSrc: Oral  SpO2: 95%  Weight: 215 lb (97.5 kg)   Body mass index is 33.67 kg/m.  Physical Exam Constitutional:      Comments: He is in good spirits as usual.  Cardiovascular:     Rate and Rhythm: Normal rate.  Pulmonary:     Effort: Pulmonary effort is normal.  Psychiatric:        Mood and Affect: Mood normal.    Lab Results Lab Results  Component Value Date   WBC 5.8 05/01/2021   HGB 16.3 05/01/2021   HCT 49.4  05/01/2021   MCV 98.2 05/01/2021   PLT 293 05/01/2021    Lab Results  Component Value Date   CREATININE 0.96 05/01/2021   BUN 22 05/01/2021   NA 138 05/01/2021   K 4.2 05/01/2021   CL 103 05/01/2021   CO2 27 05/01/2021    Lab Results  Component Value Date   ALT 28 05/01/2021   AST 22 05/01/2021   ALKPHOS 74 09/15/2020   BILITOT 0.6 05/01/2021    Lab Results  Component Value Date   CHOL 231 (H) 05/01/2021   HDL 40 05/01/2021   LDLCALC 138 (H) 05/01/2021   TRIG 345 (H) 05/01/2021   CHOLHDL 5.8 (H) 05/01/2021   Lab Results  Component Value Date   LABRPR NON-REACTIVE 05/01/2021   HIV 1 RNA Quant  Date Value  05/01/2021 <20 Copies/mL (H)  03/23/2020 <20 NOT DETECTED copies/mL  08/11/2019 <20 DETECTED copies/mL (A)   CD4 T Cell Abs (/uL)  Date Value  05/01/2021 649  03/23/2020 629  08/11/2019 578     Problem List Items Addressed This Visit       High   Human immunodeficiency virus (HIV) disease (HCC)    His infection remains under excellent, long-term control.  He will continue Odefsey and follow-up after lab work in 6 months.  I recommended that he get the new COVID booster vaccine and his annual flu shot soon.  I also encouraged him to set up a visit with a new PCP.      Relevant Orders   T-helper cell (CD4)- ([facility] clinic only)   HIV-1 RNA quant-no reflex-bld   CBC   Comprehensive metabolic panel   Lipid panel   RPR      [name] Asters, MD [facility] [phone] pager   [phone] cell 05/16/2021, 4:37 PM

## 2021-05-16 NOTE — Assessment & Plan Note (Signed)
His infection remains under excellent, long-term control.  He will continue Odefsey and follow-up after lab work in 6 months.  I recommended that he get the new COVID booster vaccine and his annual flu shot soon.  I also encouraged him to set up a visit with a new PCP.

## 2021-05-17 ENCOUNTER — Other Ambulatory Visit (HOSPITAL_COMMUNITY): Payer: Self-pay

## 2021-05-18 ENCOUNTER — Other Ambulatory Visit: Payer: Self-pay | Admitting: Internal Medicine

## 2021-05-18 ENCOUNTER — Other Ambulatory Visit (HOSPITAL_COMMUNITY): Payer: Self-pay

## 2021-05-18 DIAGNOSIS — B20 Human immunodeficiency virus [HIV] disease: Secondary | ICD-10-CM

## 2021-05-18 MED ORDER — ODEFSEY 200-25-25 MG PO TABS
1.0000 | ORAL_TABLET | Freq: Every day | ORAL | 5 refills | Status: DC
  Filled 2021-05-18: qty 30, 30d supply, fill #0
  Filled 2021-06-15: qty 30, 30d supply, fill #1
  Filled 2021-07-13: qty 30, 30d supply, fill #2
  Filled 2021-08-09: qty 30, 30d supply, fill #3
  Filled 2021-09-05: qty 30, 30d supply, fill #4
  Filled 2021-10-02: qty 30, 30d supply, fill #5

## 2021-05-19 IMAGING — DX DG LUMBAR SPINE COMPLETE 4+V
5 series · 5 of 5 positions shown · non-contrast
Comparison: None.

CLINICAL DATA: Lower back pain x2 months.

EXAM:
LUMBAR SPINE - COMPLETE 4+ VIEW

[l-spine ap]
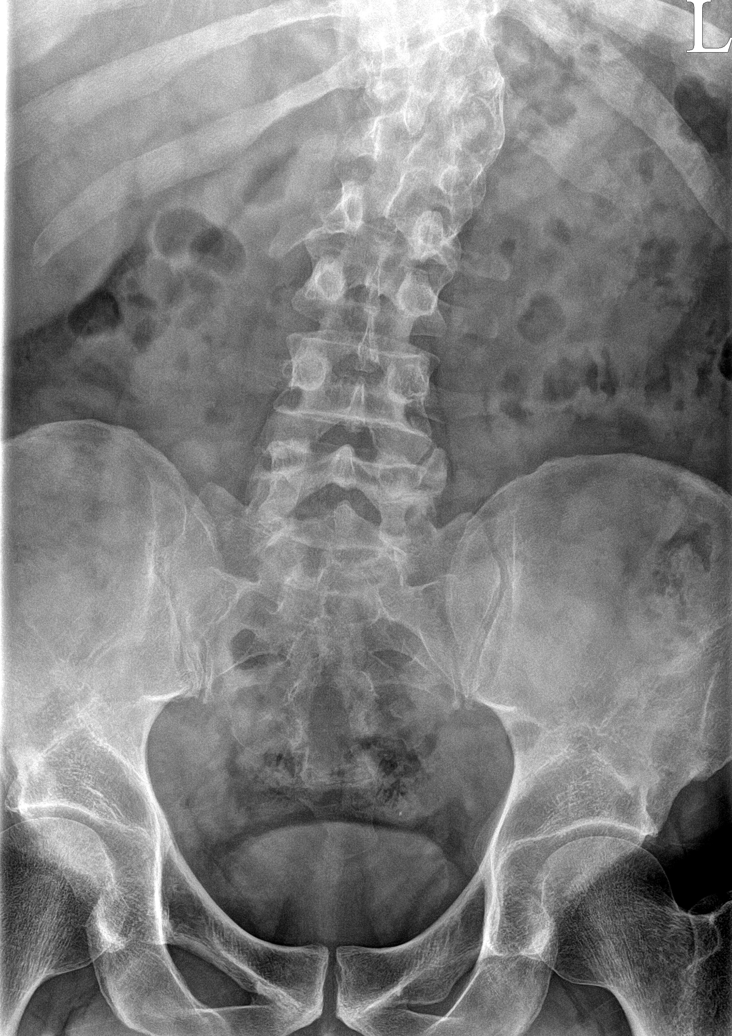

[l-spine obl (1 of 2)]
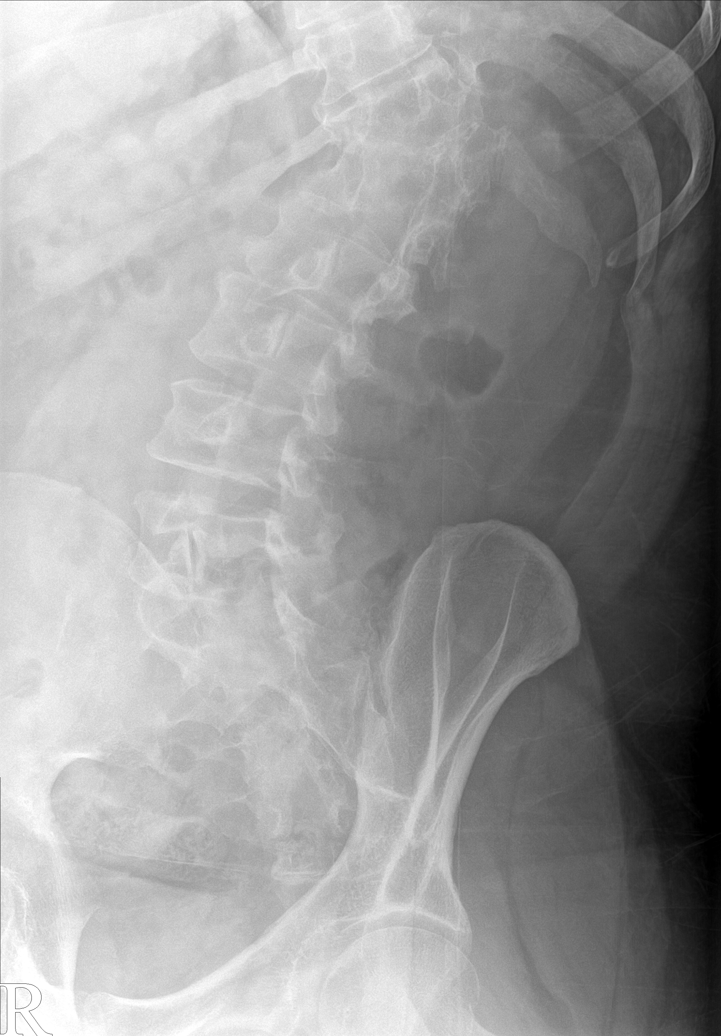

[l-spine obl (2 of 2)]
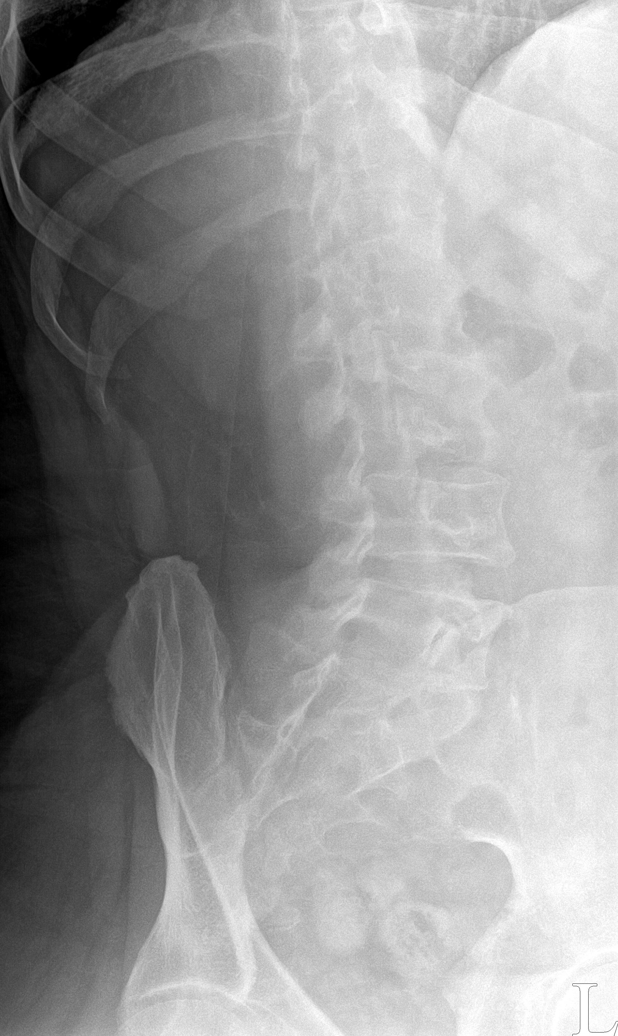

[l-spine lateral]
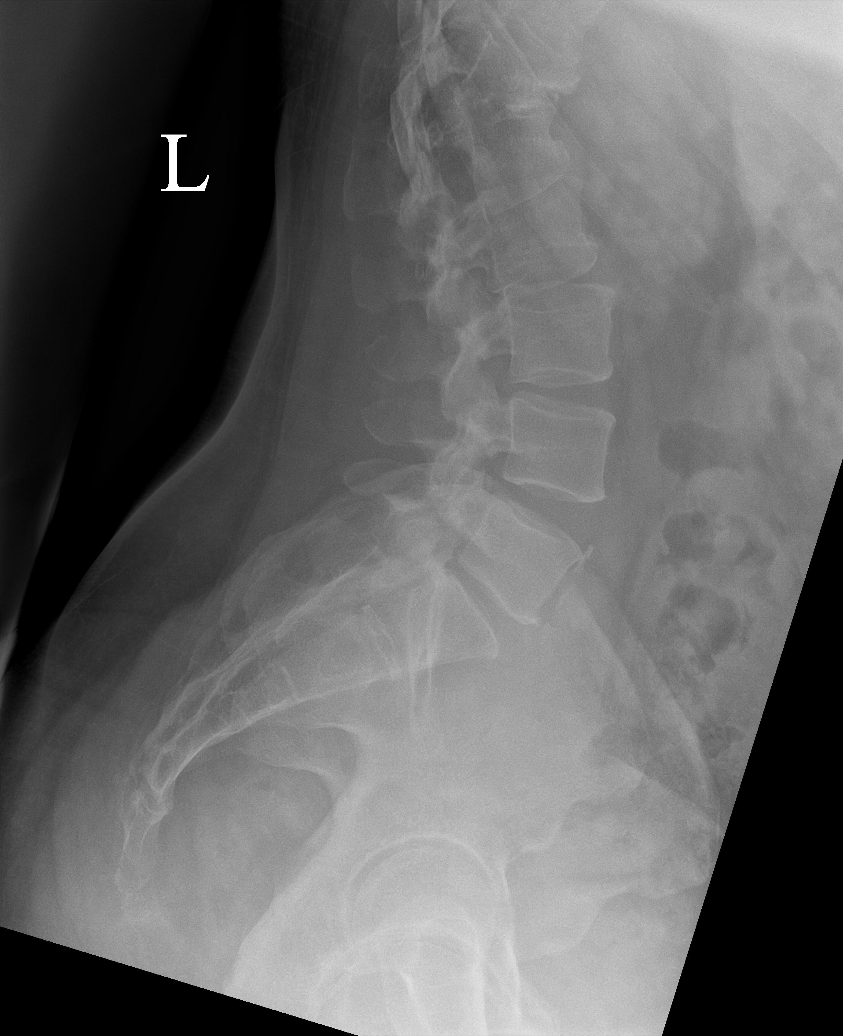

[l-spine spot]
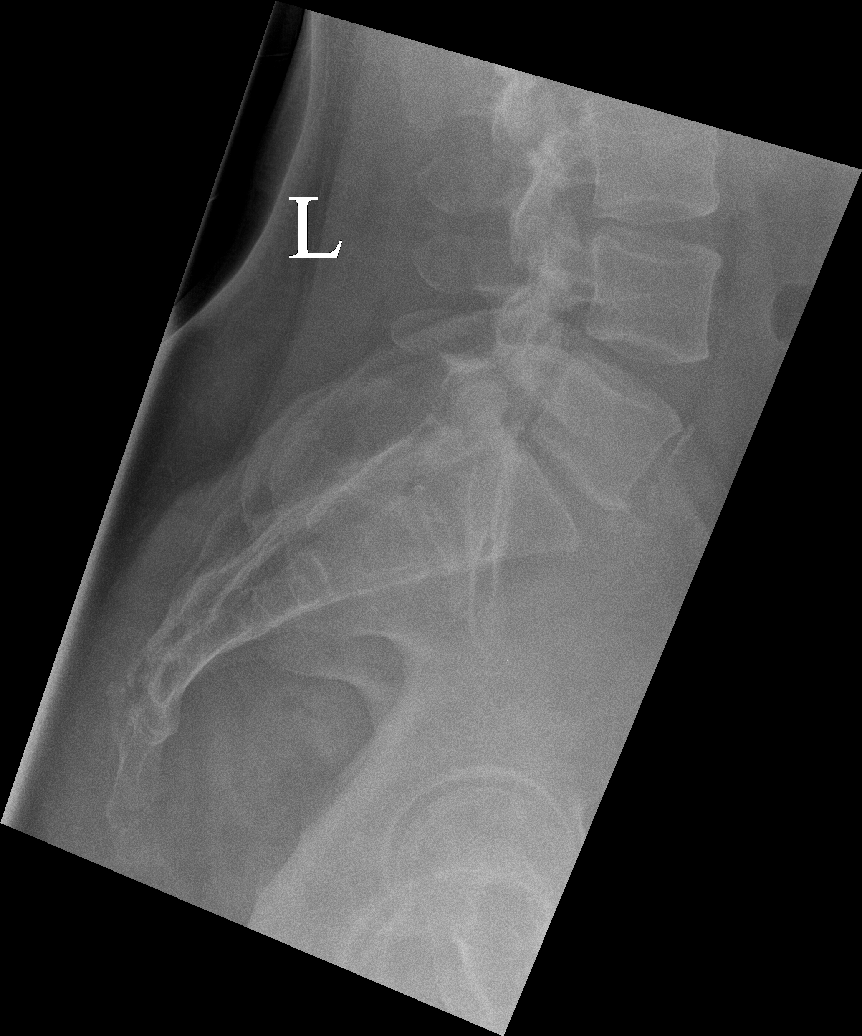

[5 of 5 positions shown; findings below may reference images not displayed]

FINDINGS: There is no evidence of an acute lumbar spine fracture. Marked
severity levoscoliosis of the lower thoracic and upper lumbar spine
is seen. Mild endplate sclerosis is noted at the level of L5-S1.
Intervertebral disc spaces are maintained.
IMPRESSION: Marked severity levoscoliosis of the lower thoracic and upper lumbar
spine with mild degenerative changes at the level of L5-S1.

## 2021-06-15 ENCOUNTER — Other Ambulatory Visit (HOSPITAL_COMMUNITY): Payer: Self-pay

## 2021-07-13 ENCOUNTER — Other Ambulatory Visit (HOSPITAL_COMMUNITY): Payer: Self-pay

## 2021-08-01 ENCOUNTER — Encounter: Payer: Self-pay | Admitting: Internal Medicine

## 2021-08-01 ENCOUNTER — Ambulatory Visit (INDEPENDENT_AMBULATORY_CARE_PROVIDER_SITE_OTHER): Payer: 59 | Admitting: Internal Medicine

## 2021-08-01 ENCOUNTER — Other Ambulatory Visit: Payer: Self-pay

## 2021-08-01 DIAGNOSIS — B20 Human immunodeficiency virus [HIV] disease: Secondary | ICD-10-CM

## 2021-08-01 NOTE — Telephone Encounter (Signed)
Spoke with patient, he accepts appointment for 3:45 this afternoon to go over paperwork.   Sandie Ano, RN

## 2021-08-01 NOTE — Assessment & Plan Note (Signed)
His infection remains under excellent, long-term control. 

## 2021-08-01 NOTE — Progress Notes (Signed)
Patient Active Problem List   Diagnosis Date Noted   Human immunodeficiency virus (HIV) disease (HCC) 05/07/2014    Priority: High   Obesity 01/17/2017    Priority: Medium    Mass of subcutaneous tissue of back 11/24/2020   Right low back pain 10/20/2020   Attention deficit hyperactivity disorder (ADHD), predominantly hyperactive type 10/07/2019   Scrotal swelling 10/07/2019   Umbilical hernia 11/10/2018   Tinea cruris 11/20/2017   Dyslipidemia 01/24/2016   History of genital warts 07/06/2014   History of chlamydia infection 07/06/2014   H/O seasonal allergies 07/05/2014    Patient's Medications  New Prescriptions   No medications on file  Previous Medications   AMPHETAMINE-DEXTROAMPHETAMINE (ADDERALL XR) 20 MG 24 HR CAPSULE    TAKE 1 CAPSULE BY MOUTH ONCE A DAY   CYCLOBENZAPRINE (FLEXERIL) 5 MG TABLET    Take 1 tablet (5 mg total) by mouth at bedtime as needed for muscle spasms.   CYCLOBENZAPRINE (FLEXERIL) 5 MG TABLET    TAKE 1 TABLET (5 MG TOTAL) BY MOUTH AT BEDTIME AS NEEDED FOR MUSCLE SPASMS.   CYCLOBENZAPRINE (FLEXERIL) 5 MG TABLET    TAKE 1 TABLET (5 MG TOTAL) BY MOUTH AT BEDTIME AS NEEDED FOR MUSCLE SPASMS.   EMTRICITABINE-RILPIVIR-TENOFOVIR AF (ODEFSEY) 200-25-25 MG TABS TABLET    Take 1 tablet by mouth daily.   LORATADINE (CLARITIN) 10 MG TABLET    Take 10 mg by mouth daily. Reported on 01/24/2016   TERBINAFINE (LAMISIL) 250 MG TABLET    Take 1 tablet (250 mg total) by mouth daily.  Modified Medications   No medications on file  Discontinued Medications   No medications on file    Subjective: Kevin Keller was seen on a work in basis today for a job application exam.  He continues to do well and has not missed any doses of his Odefsey.  He is feeling well.  Review of Systems: Review of Systems  Constitutional:  Positive for weight loss. Negative for fever.  Respiratory:  Negative for cough.   Cardiovascular:  Negative for chest pain.  Gastrointestinal:   Negative for abdominal pain.  Psychiatric/Behavioral:  Negative for depression.    Past Medical History:  Diagnosis Date   H/O seasonal allergies    HIV (human immunodeficiency virus infection) (HCC)    Excellent long term control as of 07/2018 ID f/u (on Odefsey).   Tobacco dependence in remission     Social History   Tobacco Use   Smoking status: Former    Packs/day: 0.50    Years: 13.00    Pack years: 6.50    Types: Cigarettes   Smokeless tobacco: Never  Vaping Use   Vaping Use: Former  Substance Use Topics   Alcohol use: Yes    Alcohol/week: 0.0 standard drinks    Comment: occasionally   Drug use: No    Family History  Problem Relation Age of Onset   Cancer Mother        breast   Heart disease Father 66   Cancer Paternal Aunt     No Known Allergies  Health Maintenance  Topic Date Due   COVID-19 Vaccine (3 - Pfizer risk series) 10/21/2019   INFLUENZA VACCINE  04/03/2021   TETANUS/TDAP  08/03/2021   Pneumococcal Vaccine 78-15 Years old (4 - PPSV23 if available, else PCV20) 02/23/2045   Hepatitis C Screening  Completed   HIV Screening  Completed   HPV VACCINES  Aged Out  Objective:  Vitals:   08/01/21 1555  BP: (!) 143/85  Pulse: 81  Temp: 98.2 F (36.8 C)  TempSrc: Temporal  SpO2: 97%  Weight: 212 lb (96.2 kg)   Body mass index is 33.2 kg/m.  Physical Exam Constitutional:      Comments: He is in good spirits as usual.  HENT:     Nose: Nose normal.     Mouth/Throat:     Mouth: Mucous membranes are moist.     Pharynx: Oropharynx is clear. No oropharyngeal exudate.  Eyes:     Conjunctiva/sclera: Conjunctivae normal.     Pupils: Pupils are equal, round, and reactive to light.  Cardiovascular:     Rate and Rhythm: Normal rate and regular rhythm.     Heart sounds: No murmur heard. Pulmonary:     Effort: Pulmonary effort is normal.     Breath sounds: Normal breath sounds.  Abdominal:     Palpations: Abdomen is soft.     Tenderness:  There is no abdominal tenderness.     Comments: He has a quarter sized, reducible umbilical hernia.  Musculoskeletal:        General: No swelling or tenderness.  Skin:    Findings: No rash.  Neurological:     General: No focal deficit present.     Gait: Gait normal.  Psychiatric:        Mood and Affect: Mood normal.    Lab Results Lab Results  Component Value Date   WBC 5.8 05/01/2021   HGB 16.3 05/01/2021   HCT 49.4 05/01/2021   MCV 98.2 05/01/2021   PLT 293 05/01/2021    Lab Results  Component Value Date   CREATININE 0.96 05/01/2021   BUN 22 05/01/2021   NA 138 05/01/2021   K 4.2 05/01/2021   CL 103 05/01/2021   CO2 27 05/01/2021    Lab Results  Component Value Date   ALT 28 05/01/2021   AST 22 05/01/2021   ALKPHOS 74 09/15/2020   BILITOT 0.6 05/01/2021    Lab Results  Component Value Date   CHOL 231 (H) 05/01/2021   HDL 40 05/01/2021   LDLCALC 138 (H) 05/01/2021   TRIG 345 (H) 05/01/2021   CHOLHDL 5.8 (H) 05/01/2021   Lab Results  Component Value Date   LABRPR NON-REACTIVE 05/01/2021   HIV 1 RNA Quant  Date Value  05/01/2021 <20 Copies/mL (H)  03/23/2020 <20 NOT DETECTED copies/mL  08/11/2019 <20 DETECTED copies/mL (A)   CD4 T Cell Abs (/uL)  Date Value  05/01/2021 649  03/23/2020 629  08/11/2019 578     Problem List Items Addressed This Visit       High   Human immunodeficiency virus (HIV) disease (HCC)    His infection remains under excellent, long-term control.         [name] Asters, MD [facility] for Infectious Disease [facility] [phone] pager   [phone] cell 08/01/2021, 4:15 PM

## 2021-08-09 ENCOUNTER — Other Ambulatory Visit (HOSPITAL_COMMUNITY): Payer: Self-pay

## 2021-09-04 ENCOUNTER — Other Ambulatory Visit (HOSPITAL_COMMUNITY): Payer: Self-pay

## 2021-09-05 ENCOUNTER — Other Ambulatory Visit (HOSPITAL_COMMUNITY): Payer: Self-pay

## 2021-09-06 ENCOUNTER — Other Ambulatory Visit (HOSPITAL_COMMUNITY): Payer: Self-pay

## 2021-09-14 ENCOUNTER — Other Ambulatory Visit (HOSPITAL_COMMUNITY): Payer: Self-pay

## 2021-09-14 MED FILL — Cyclobenzaprine HCl Tab 5 MG: ORAL | 30 days supply | Qty: 30 | Fill #0 | Status: AC

## 2021-09-19 ENCOUNTER — Other Ambulatory Visit: Payer: Self-pay | Admitting: Student

## 2021-09-19 ENCOUNTER — Other Ambulatory Visit (HOSPITAL_COMMUNITY): Payer: Self-pay

## 2021-09-19 DIAGNOSIS — F901 Attention-deficit hyperactivity disorder, predominantly hyperactive type: Secondary | ICD-10-CM

## 2021-09-20 ENCOUNTER — Other Ambulatory Visit (HOSPITAL_COMMUNITY): Payer: Self-pay

## 2021-09-20 ENCOUNTER — Ambulatory Visit (INDEPENDENT_AMBULATORY_CARE_PROVIDER_SITE_OTHER): Payer: 59 | Admitting: Internal Medicine

## 2021-09-20 ENCOUNTER — Other Ambulatory Visit: Payer: Self-pay

## 2021-09-20 ENCOUNTER — Encounter: Payer: Self-pay | Admitting: Internal Medicine

## 2021-09-20 VITALS — BP 130/64 | HR 79 | Temp 98.0°F | Ht 67.0 in | Wt 219.4 lb

## 2021-09-20 DIAGNOSIS — E785 Hyperlipidemia, unspecified: Secondary | ICD-10-CM | POA: Diagnosis not present

## 2021-09-20 DIAGNOSIS — F901 Attention-deficit hyperactivity disorder, predominantly hyperactive type: Secondary | ICD-10-CM | POA: Diagnosis not present

## 2021-09-20 DIAGNOSIS — Z0000 Encounter for general adult medical examination without abnormal findings: Secondary | ICD-10-CM

## 2021-09-20 NOTE — Patient Instructions (Signed)
Thank you, Mr.Kanav Monjaraz for allowing [place] to provide your care today. Today we discussed:  We obtained a urine sample so that we may represcribe your Adderall.  Otherwise your blood work all looks good.  We can see you back in 1 year for a routine follow-up.  If you are able to, please obtain the records from employee health that you received your tetanus/Tdap booster, and the date so that we may update our system here.  Best of luck with wedding planning!  I have ordered the following labs for you:   Lab Orders         ToxAssure Select,+Antidepr,UR       My Chart Access: [email or web address]  Please follow-up in 1 year.  Please make sure to arrive 15 minutes prior to your next appointment. If you arrive late, you may be asked to reschedule.    We look forward to seeing you next time. Please call our clinic at [phone] if you have any questions or concerns. The best time to call is Monday-Friday from 9am-4pm, but there is someone available 24/7. If after hours or the weekend, call the main hospital number and ask for the Internal Medicine Resident On-Call. If you need medication refills, please notify your pharmacy one week in advance and they will send [place] a request.   Thank you for letting us take part in your care. Wishing you the best!  Wayland Denis, MD 09/20/2021, 4:48 PM IM Resident, PGY-1

## 2021-09-20 NOTE — Progress Notes (Addendum)
°  CC: routine follow up, urine toxicology test for Adderall refill  HPI:  [name] is a 40 y.o. male with a past medical history stated below and presents today for routine follow up, urine toxicology test for Adderall refill. Please see problem based assessment and plan for additional details.  Past Medical History:  Diagnosis Date   H/O seasonal allergies    HIV (human immunodeficiency virus infection) ([place])    Excellent long term control as of 07/2018 ID f/u (on Odefsey).   Tobacco dependence in remission     Current Outpatient Medications on File Prior to Visit  Medication Sig Dispense Refill   amphetamine-dextroamphetamine (ADDERALL XR) 20 MG 24 hr capsule TAKE 1 CAPSULE BY MOUTH ONCE A DAY (Patient not taking: Reported on 05/16/2021) 30 capsule 0   cyclobenzaprine (FLEXERIL) 5 MG tablet Take 1 tablet (5 mg total) by mouth at bedtime as needed for muscle spasms. (Patient not taking: Reported on 05/16/2021) 30 tablet 1   cyclobenzaprine (FLEXERIL) 5 MG tablet TAKE 1 TABLET (5 MG TOTAL) BY MOUTH AT BEDTIME AS NEEDED FOR MUSCLE SPASMS. (Patient not taking: Reported on 05/16/2021) 30 tablet 1   cyclobenzaprine (FLEXERIL) 5 MG tablet Take 1 tablet (5 mg total) by mouth at bedtime as needed for muscle spasms 30 tablet 1   emtricitabine-rilpivir-tenofovir AF (ODEFSEY) 200-25-25 MG TABS tablet Take 1 tablet by mouth daily. 30 tablet 5   loratadine (CLARITIN) 10 MG tablet Take 10 mg by mouth daily. Reported on 01/24/2016 (Patient not taking: Reported on 05/16/2021)     terbinafine (LAMISIL) 250 MG tablet Take 1 tablet (250 mg total) by mouth daily. (Patient not taking: Reported on 05/16/2021) 14 tablet 0   No current facility-administered medications on file prior to visit.    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  Review of Systems: ROS negative except for what is noted on the assessment and plan.  Vitals:   09/20/21 1554  BP: 130/64  Pulse: 79  Temp: 98 F (36.7 C)  TempSrc: Oral  Weight: 219 lb 6.4 oz (99.5 kg)  Height: 5\' 7"  (1.702 m)    Physical Exam: General: Well appearing Caucasian male, NAD HENT: normocephalic, atraumatic EYES: conjunctiva non-erythematous, no scleral icterus CV: regular rate, normal rhythm, no murmurs, rubs, gallops.  No lower extremity edema. Pulmonary: normal work of breathing on RA, lungs clear to auscultation, no rales, wheezes, rhonchi Abdominal: non-distended, soft, non-tender to palpation, normal BS Skin: Warm and dry, no rashes or lesions Neurological: MS: awake, alert and oriented x3, normal speech and fund of knowledge Motor: moves all extremities antigravity Psych: normal affect    Assessment & Plan:   See Encounters Tab for problem based charting.  Patient discussed with Dr. Lonzo Cloud, M.D. [facility], PGY-1 Pager: [phone] Date 09/20/2021 Time 4:22 PM

## 2021-09-20 NOTE — Assessment & Plan Note (Addendum)
Patient with history of ADHD since childhood presents for routine follow-up.  Patient is currently in school getting his associates in science degree.  Notes that Adderall helps him stay focused while he is in class.  Is hoping to get his bachelor's degree in the future.  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  Does not have any difficulty completing his tasks for work, occasionally has difficulty choosing which to do first but in general multitasks well.   Patient is otherwise doing well.  Reports no new health complaints.  He is happy with how his life is going at the moment.  He is planning a wedding with his partner.  Plan: -Tox assure urine test today -Refill Adderall after results of tox assure  ADDENDUM: Toxassure negative for Adderall. Called patient who reports he has not taken medication for several months. Reports requesting refill since it has been more difficult for him to pay attention in class as described above. On chart review, Adderall prescription last refilled in July 2022 for 30d course. PDMP appropriate. Given patient has long standing hx of ADHD and finds benefit from taking medication while pursuing associates degree, with PDMP appropriate, will refill.

## 2021-09-20 NOTE — Assessment & Plan Note (Signed)
Patient has previously received tetanus/Tdap when he first started working at [employer].  Unfortunately we do not have those records in our system.  Patient was encouraged to obtain records so that we may update our system.

## 2021-09-20 NOTE — Telephone Encounter (Signed)
Refill Request   Pt calling back to f/u with his Refill   amphetamine-dextroamphetamine (ADDERALL XR) 20 MG 24 hr capsule   [facility]

## 2021-09-20 NOTE — Assessment & Plan Note (Signed)
Last lipid panel 04/2021 with LDL 138, total cholesterol 231, triglycerides 345, and HDL 40.  ASCVD 10-year risk score less than 5%.  No medical management needed at this time.  Discussed lifestyle modifications.  Patient reports weight loss in the last 2 years, watching his diet, reducing sweetened beverages, working on portion sizes, less fried foods.  Reports is working out at home 2-3 times per week incorporating weights into his workout.  Plan: -Continue lifestyle modifications

## 2021-09-21 NOTE — Progress Notes (Signed)
Internal Medicine Clinic Attending ? ?Case discussed with Dr. Zinoviev  At the time of the visit.  We reviewed the resident?s history and exam and pertinent patient test results.  I agree with the assessment, diagnosis, and plan of care documented in the resident?s note.  ?

## 2021-09-25 ENCOUNTER — Other Ambulatory Visit (HOSPITAL_COMMUNITY): Payer: Self-pay

## 2021-09-25 LAB — TOXASSURE SELECT,+ANTIDEPR,UR

## 2021-09-25 MED ORDER — AMPHETAMINE-DEXTROAMPHET ER 20 MG PO CP24
20.0000 mg | ORAL_CAPSULE | Freq: Every day | ORAL | 0 refills | Status: DC
  Filled 2021-09-25: qty 30, 30d supply, fill #0

## 2021-09-25 NOTE — Addendum Note (Signed)
Addended byVinetta Bergamo, Ulus Hazen on: 09/25/2021 09:00 AM   Modules accepted: Orders

## 2021-10-02 ENCOUNTER — Other Ambulatory Visit (HOSPITAL_COMMUNITY): Payer: Self-pay

## 2021-10-05 ENCOUNTER — Other Ambulatory Visit (HOSPITAL_COMMUNITY): Payer: Self-pay

## 2021-10-18 DIAGNOSIS — S39012A Strain of muscle, fascia and tendon of lower back, initial encounter: Secondary | ICD-10-CM | POA: Diagnosis not present

## 2021-10-18 DIAGNOSIS — S29012A Strain of muscle and tendon of back wall of thorax, initial encounter: Secondary | ICD-10-CM | POA: Diagnosis not present

## 2021-10-18 DIAGNOSIS — S134XXA Sprain of ligaments of cervical spine, initial encounter: Secondary | ICD-10-CM | POA: Diagnosis not present

## 2021-10-26 ENCOUNTER — Other Ambulatory Visit (HOSPITAL_COMMUNITY): Payer: Self-pay

## 2021-10-26 ENCOUNTER — Other Ambulatory Visit: Payer: Self-pay | Admitting: Internal Medicine

## 2021-10-26 DIAGNOSIS — B20 Human immunodeficiency virus [HIV] disease: Secondary | ICD-10-CM

## 2021-10-26 DIAGNOSIS — F901 Attention-deficit hyperactivity disorder, predominantly hyperactive type: Secondary | ICD-10-CM

## 2021-10-26 MED ORDER — ODEFSEY 200-25-25 MG PO TABS
1.0000 | ORAL_TABLET | Freq: Every day | ORAL | 3 refills | Status: DC
  Filled 2021-10-26: qty 30, 30d supply, fill #0

## 2021-10-29 ENCOUNTER — Encounter: Payer: Self-pay | Admitting: Internal Medicine

## 2021-10-29 MED ORDER — AMPHETAMINE-DEXTROAMPHET ER 20 MG PO CP24: 20.0000 mg | ORAL_CAPSULE | Freq: Every day | ORAL | 0 refills | Status: DC

## 2021-10-30 ENCOUNTER — Other Ambulatory Visit (HOSPITAL_COMMUNITY): Payer: Self-pay

## 2021-10-30 ENCOUNTER — Other Ambulatory Visit: Payer: Self-pay | Admitting: Internal Medicine

## 2021-10-30 DIAGNOSIS — F901 Attention-deficit hyperactivity disorder, predominantly hyperactive type: Secondary | ICD-10-CM

## 2021-10-30 MED ORDER — AMPHETAMINE-DEXTROAMPHET ER 20 MG PO CP24
20.0000 mg | ORAL_CAPSULE | Freq: Every day | ORAL | 0 refills | Status: DC
  Filled 2021-10-30: qty 30, 30d supply, fill #0

## 2021-10-30 NOTE — Telephone Encounter (Signed)
Please resend prescription for Adderall XR Pharmacy did not receive.

## 2021-10-31 ENCOUNTER — Other Ambulatory Visit (HOSPITAL_COMMUNITY): Payer: Self-pay

## 2021-11-01 ENCOUNTER — Other Ambulatory Visit (HOSPITAL_COMMUNITY): Payer: Self-pay

## 2021-11-03 ENCOUNTER — Other Ambulatory Visit: Payer: Self-pay

## 2021-11-03 DIAGNOSIS — B20 Human immunodeficiency virus [HIV] disease: Secondary | ICD-10-CM

## 2021-11-07 ENCOUNTER — Other Ambulatory Visit: Payer: Self-pay

## 2021-11-07 ENCOUNTER — Other Ambulatory Visit: Payer: 59

## 2021-11-07 DIAGNOSIS — B20 Human immunodeficiency virus [HIV] disease: Secondary | ICD-10-CM

## 2021-11-09 LAB — COMPREHENSIVE METABOLIC PANEL
AG Ratio: 1.5 (calc) (ref 1.0–2.5)
ALT: 28 U/L (ref 9–46)
AST: 21 U/L (ref 10–40)
Albumin: 4.6 g/dL (ref 3.6–5.1)
Alkaline phosphatase (APISO): 66 U/L (ref 36–130)
BUN: 17 mg/dL (ref 7–25)
CO2: 27 mmol/L (ref 20–32)
Calcium: 9.5 mg/dL (ref 8.6–10.3)
Chloride: 100 mmol/L (ref 98–110)
Creat: 1 mg/dL (ref 0.60–1.29)
Globulin: 3 g/dL (calc) (ref 1.9–3.7)
Glucose, Bld: 83 mg/dL (ref 65–99)
Potassium: 3.9 mmol/L (ref 3.5–5.3)
Sodium: 136 mmol/L (ref 135–146)
Total Bilirubin: 0.7 mg/dL (ref 0.2–1.2)
Total Protein: 7.6 g/dL (ref 6.1–8.1)

## 2021-11-09 LAB — LIPID PANEL
Cholesterol: 212 mg/dL — ABNORMAL HIGH (ref <200)
HDL: 39 mg/dL — ABNORMAL LOW (ref >=40)
LDL Cholesterol (Calc): 131 mg/dL (calc) — ABNORMAL HIGH
Non-HDL Cholesterol (Calc): 173 mg/dL (calc) — ABNORMAL HIGH (ref <130)
Total CHOL/HDL Ratio: 5.4 (calc) — ABNORMAL HIGH (ref <5.0)
Triglycerides: 270 mg/dL — ABNORMAL HIGH (ref <150)

## 2021-11-09 LAB — CBC
HCT: 48.6 % (ref 38.5–50.0)
Hemoglobin: 16.5 g/dL (ref 13.2–17.1)
MCH: 32.4 pg (ref 27.0–33.0)
MCHC: 34 g/dL (ref 32.0–36.0)
MCV: 95.3 fL (ref 80.0–100.0)
MPV: 10 fL (ref 7.5–12.5)
Platelets: 283 10*3/uL (ref 140–400)
RBC: 5.1 10*6/uL (ref 4.20–5.80)
RDW: 11.8 % (ref 11.0–15.0)
WBC: 5.8 10*3/uL (ref 3.8–10.8)

## 2021-11-09 LAB — T-HELPER CELLS (CD4) COUNT (NOT AT ARMC)
Absolute CD4: 620 cells/uL (ref 490–1740)
CD4 T Helper %: 27 % — ABNORMAL LOW (ref 30–61)
Total lymphocyte count: 2320 cells/uL (ref 850–3900)

## 2021-11-09 LAB — RPR: RPR Ser Ql: NONREACTIVE

## 2021-11-09 LAB — HIV-1 RNA QUANT-NO REFLEX-BLD
HIV 1 RNA Quant: NOT DETECTED Copies/mL
HIV-1 RNA Quant, Log: NOT DETECTED Log cps/mL

## 2021-11-21 ENCOUNTER — Ambulatory Visit (INDEPENDENT_AMBULATORY_CARE_PROVIDER_SITE_OTHER): Payer: 59 | Admitting: Internal Medicine

## 2021-11-21 ENCOUNTER — Other Ambulatory Visit (HOSPITAL_COMMUNITY): Payer: Self-pay

## 2021-11-21 ENCOUNTER — Other Ambulatory Visit: Payer: Self-pay

## 2021-11-21 ENCOUNTER — Encounter: Payer: Self-pay | Admitting: Internal Medicine

## 2021-11-21 DIAGNOSIS — B20 Human immunodeficiency virus [HIV] disease: Secondary | ICD-10-CM | POA: Diagnosis not present

## 2021-11-21 MED ORDER — ODEFSEY 200-25-25 MG PO TABS
1.0000 | ORAL_TABLET | Freq: Every day | ORAL | 11 refills | Status: DC
  Filled 2021-11-21 – 2021-11-29 (×2): qty 30, 30d supply, fill #0
  Filled 2021-12-25 (×2): qty 30, 30d supply, fill #1
  Filled 2022-01-22: qty 30, 30d supply, fill #2
  Filled 2022-02-16: qty 30, 30d supply, fill #3
  Filled 2022-03-14: qty 30, 30d supply, fill #4
  Filled 2022-04-17: qty 30, 30d supply, fill #5
  Filled 2022-05-15: qty 30, 30d supply, fill #6

## 2021-11-21 NOTE — Progress Notes (Signed)
Virtual Visit via Telephone Note ? ?I connected with Kevin Keller on 11/21/21 at  4:00 PM EDT by telephone and verified that I am speaking with the correct person using two identifiers. ? ?Location: ?Patient: Home ?Provider: [facility] ?  ?I discussed the limitations, risks, security and privacy concerns of performing an evaluation and management service by telephone and the availability of in person appointments. I also discussed with the patient that there may be a patient responsible charge related to this service. The patient expressed understanding and agreed to proceed. ? ? ?History of Present Illness: ?I called and spoke with Kevin Keller today.  He denies any problems obtaining, taking or tolerating his Odefsey and has not been missing any doses. ?  ?Observations/Objective: ?HIV 1 RNA Quant  ?Date Value  ?11/07/2021 Not Detected Copies/mL  ?05/01/2021 <20 Copies/mL (H)  ?03/23/2020 <20 NOT DETECTED copies/mL  ? ?CD4 T Cell Abs (/uL)  ?Date Value  ?05/01/2021 649  ?03/23/2020 629  ?08/11/2019 578  ?  ? ?Assessment and Plan: ?His infection remains under excellent, long-term control. ? ?Follow Up Instructions: ?Continue Odefsey ?Follow-up after lab work in 6 months ?  ?I discussed the assessment and treatment plan with the patient. The patient was provided an opportunity to ask questions and all were answered. The patient agreed with the plan and demonstrated an understanding of the instructions. ?  ?The patient was advised to call back or seek an in-person evaluation if the symptoms worsen or if the condition fails to improve as anticipated. ? ?I provided 14 minutes of non-face-to-face time during this encounter. ? ? ?Michel Bickers, MD  ?

## 2021-11-23 ENCOUNTER — Other Ambulatory Visit (HOSPITAL_COMMUNITY): Payer: Self-pay

## 2021-11-27 ENCOUNTER — Other Ambulatory Visit (HOSPITAL_COMMUNITY): Payer: Self-pay

## 2021-11-29 ENCOUNTER — Other Ambulatory Visit (HOSPITAL_COMMUNITY): Payer: Self-pay

## 2021-11-29 ENCOUNTER — Other Ambulatory Visit: Payer: Self-pay | Admitting: Internal Medicine

## 2021-11-29 DIAGNOSIS — F901 Attention-deficit hyperactivity disorder, predominantly hyperactive type: Secondary | ICD-10-CM

## 2021-11-30 ENCOUNTER — Other Ambulatory Visit (HOSPITAL_COMMUNITY): Payer: Self-pay

## 2021-11-30 MED ORDER — AMPHETAMINE-DEXTROAMPHET ER 20 MG PO CP24
20.0000 mg | ORAL_CAPSULE | Freq: Every day | ORAL | 0 refills | Status: DC
  Filled 2021-11-30: qty 30, 30d supply, fill #0

## 2021-12-01 ENCOUNTER — Other Ambulatory Visit (HOSPITAL_COMMUNITY): Payer: Self-pay

## 2021-12-18 ENCOUNTER — Other Ambulatory Visit (HOSPITAL_COMMUNITY): Payer: Self-pay

## 2021-12-20 ENCOUNTER — Other Ambulatory Visit (HOSPITAL_COMMUNITY): Payer: Self-pay

## 2021-12-25 ENCOUNTER — Other Ambulatory Visit (HOSPITAL_COMMUNITY): Payer: Self-pay

## 2021-12-27 ENCOUNTER — Other Ambulatory Visit: Payer: Self-pay | Admitting: Internal Medicine

## 2021-12-27 ENCOUNTER — Other Ambulatory Visit (HOSPITAL_COMMUNITY): Payer: Self-pay

## 2021-12-27 DIAGNOSIS — F901 Attention-deficit hyperactivity disorder, predominantly hyperactive type: Secondary | ICD-10-CM

## 2021-12-29 ENCOUNTER — Other Ambulatory Visit (HOSPITAL_COMMUNITY): Payer: Self-pay

## 2021-12-29 MED ORDER — AMPHETAMINE-DEXTROAMPHET ER 20 MG PO CP24
20.0000 mg | ORAL_CAPSULE | Freq: Every day | ORAL | 0 refills | Status: DC
  Filled 2022-01-01: qty 30, 30d supply, fill #0

## 2022-01-01 ENCOUNTER — Other Ambulatory Visit (HOSPITAL_COMMUNITY): Payer: Self-pay

## 2022-01-22 ENCOUNTER — Other Ambulatory Visit (HOSPITAL_COMMUNITY): Payer: Self-pay

## 2022-01-24 ENCOUNTER — Other Ambulatory Visit (HOSPITAL_COMMUNITY): Payer: Self-pay

## 2022-02-02 ENCOUNTER — Other Ambulatory Visit (HOSPITAL_COMMUNITY): Payer: Self-pay

## 2022-02-02 ENCOUNTER — Other Ambulatory Visit: Payer: Self-pay | Admitting: Internal Medicine

## 2022-02-02 DIAGNOSIS — F901 Attention-deficit hyperactivity disorder, predominantly hyperactive type: Secondary | ICD-10-CM

## 2022-02-02 MED ORDER — AMPHETAMINE-DEXTROAMPHET ER 20 MG PO CP24
20.0000 mg | ORAL_CAPSULE | Freq: Every day | ORAL | 0 refills | Status: DC
  Filled 2022-02-02: qty 30, 30d supply, fill #0

## 2022-02-13 ENCOUNTER — Other Ambulatory Visit (HOSPITAL_COMMUNITY): Payer: Self-pay

## 2022-02-15 ENCOUNTER — Other Ambulatory Visit (HOSPITAL_COMMUNITY): Payer: Self-pay

## 2022-02-16 ENCOUNTER — Other Ambulatory Visit (HOSPITAL_COMMUNITY): Payer: Self-pay

## 2022-02-23 ENCOUNTER — Other Ambulatory Visit (HOSPITAL_COMMUNITY): Payer: Self-pay

## 2022-02-24 ENCOUNTER — Encounter: Payer: Self-pay | Admitting: *Deleted

## 2022-03-14 ENCOUNTER — Other Ambulatory Visit (HOSPITAL_COMMUNITY): Payer: Self-pay

## 2022-03-26 ENCOUNTER — Other Ambulatory Visit (HOSPITAL_COMMUNITY): Payer: Self-pay

## 2022-04-17 ENCOUNTER — Other Ambulatory Visit (HOSPITAL_COMMUNITY): Payer: Self-pay

## 2022-04-23 ENCOUNTER — Other Ambulatory Visit (HOSPITAL_COMMUNITY): Payer: Self-pay

## 2022-05-11 ENCOUNTER — Other Ambulatory Visit: Payer: Self-pay

## 2022-05-11 ENCOUNTER — Other Ambulatory Visit: Payer: 59

## 2022-05-11 DIAGNOSIS — B20 Human immunodeficiency virus [HIV] disease: Secondary | ICD-10-CM | POA: Diagnosis not present

## 2022-05-14 LAB — T-HELPER CELLS (CD4) COUNT (NOT AT ARMC)
Absolute CD4: 641 cells/uL (ref 490–1740)
CD4 T Helper %: 34 % (ref 30–61)
Total lymphocyte count: 1892 cells/uL (ref 850–3900)

## 2022-05-14 LAB — HIV-1 RNA QUANT-NO REFLEX-BLD
HIV 1 RNA Quant: NOT DETECTED Copies/mL
HIV-1 RNA Quant, Log: NOT DETECTED Log cps/mL

## 2022-05-15 ENCOUNTER — Other Ambulatory Visit (HOSPITAL_COMMUNITY): Payer: Self-pay

## 2022-05-24 ENCOUNTER — Ambulatory Visit (INDEPENDENT_AMBULATORY_CARE_PROVIDER_SITE_OTHER): Payer: 59 | Admitting: Internal Medicine

## 2022-05-24 ENCOUNTER — Other Ambulatory Visit (HOSPITAL_COMMUNITY): Payer: Self-pay

## 2022-05-24 ENCOUNTER — Other Ambulatory Visit: Payer: Self-pay

## 2022-05-24 ENCOUNTER — Encounter: Payer: Self-pay | Admitting: Internal Medicine

## 2022-05-24 DIAGNOSIS — K429 Umbilical hernia without obstruction or gangrene: Secondary | ICD-10-CM

## 2022-05-24 DIAGNOSIS — B20 Human immunodeficiency virus [HIV] disease: Secondary | ICD-10-CM

## 2022-05-24 DIAGNOSIS — E785 Hyperlipidemia, unspecified: Secondary | ICD-10-CM | POA: Diagnosis not present

## 2022-05-24 MED ORDER — ODEFSEY 200-25-25 MG PO TABS
1.0000 | ORAL_TABLET | Freq: Every day | ORAL | 11 refills | Status: DC
  Filled 2022-05-24: qty 30, 30d supply, fill #0
  Filled 2022-06-14: qty 30, 30d supply, fill #1
  Filled 2022-07-12: qty 30, 30d supply, fill #2
  Filled 2022-08-10: qty 30, 30d supply, fill #3
  Filled 2022-09-10 (×2): qty 30, 30d supply, fill #4
  Filled 2022-10-03: qty 30, 30d supply, fill #5
  Filled 2022-11-06: qty 30, 30d supply, fill #6
  Filled 2022-12-03: qty 30, 30d supply, fill #7
  Filled 2023-01-07: qty 30, 30d supply, fill #8
  Filled 2023-01-31: qty 30, 30d supply, fill #9
  Filled 2023-02-20: qty 30, 30d supply, fill #10
  Filled 2023-03-25: qty 30, 30d supply, fill #11

## 2022-05-24 NOTE — Assessment & Plan Note (Signed)
I will make a referral for evaluation by general surgery.

## 2022-05-24 NOTE — Assessment & Plan Note (Signed)
His infection remains under excellent, long-term control.  He will continue Odefsey and follow-up after lab work in 1 year.  I told him that we currently have no therapies that are more effective, safer or simpler than Odefsey.

## 2022-05-24 NOTE — Assessment & Plan Note (Signed)
He does have a family history of cardiovascular disease.  We will help him get a new, permanent PCP within [facility].

## 2022-05-24 NOTE — Progress Notes (Signed)
Patient Active Problem List   Diagnosis Date Noted   Human immunodeficiency virus (HIV) disease (HCC) 05/07/2014    Priority: High   Obesity 01/17/2017    Priority: Medium    Health care maintenance 09/20/2021   Mass of subcutaneous tissue of back 11/24/2020   Right low back pain 10/20/2020   Attention deficit hyperactivity disorder (ADHD), predominantly hyperactive type 10/07/2019   Scrotal swelling 10/07/2019   Umbilical hernia 11/10/2018   Tinea cruris 11/20/2017   Dyslipidemia 01/24/2016   History of genital warts 07/06/2014   History of chlamydia infection 07/06/2014   H/O seasonal allergies 07/05/2014    Patient's Medications  New Prescriptions   No medications on file  Previous Medications   AMPHETAMINE-DEXTROAMPHETAMINE (ADDERALL XR) 20 MG 24 HR CAPSULE    Take 1 capsule by mouth daily.   CYCLOBENZAPRINE (FLEXERIL) 5 MG TABLET    Take 1 tablet (5 mg total) by mouth at bedtime as needed for muscle spasms   LORATADINE (CLARITIN) 10 MG TABLET    Take 10 mg by mouth daily. Reported on 01/24/2016  Modified Medications   Modified Medication Previous Medication   EMTRICITABINE-RILPIVIR-TENOFOVIR AF (ODEFSEY) 200-25-25 MG TABS TABLET emtricitabine-rilpivir-tenofovir AF (ODEFSEY) 200-25-25 MG TABS tablet      Take 1 tablet by mouth daily.    Take 1 tablet by mouth daily.  Discontinued Medications   No medications on file    Subjective: Kevin Keller is in for his routine HIV follow-up visit.  He has not had any problems obtaining, taking or tolerating Odefsey and rarely misses doses.  He is feeling well.  He is still in school graduate in May but continues to work full-time at [employer].  He has been exercising more vigorously recently and says that he has lost some weight and gained muscle.  His only current issue is a slightly larger umbilical hernia.  He states that he has occasional a.m. nausea but no vomiting.  He is not having any pain related to his hernia.  He  has been followed in the internal medicine clinic in the past but does not feel like he can relate to the resident physicians he is seen on a rotating basis.  Essentially he does not have a current PCP.  Review of Systems: Review of Systems  Gastrointestinal:  Positive for nausea. Negative for abdominal pain, diarrhea and vomiting.    Past Medical History:  Diagnosis Date   H/O seasonal allergies    HIV (human immunodeficiency virus infection) (HCC)    Excellent long term control as of 07/2018 ID f/u (on Odefsey).   Tobacco dependence in remission     Social History   Tobacco Use   Smoking status: Former    Packs/day: 0.50    Years: 13.00    Total pack years: 6.50    Types: Cigarettes   Smokeless tobacco: Never  Vaping Use   Vaping Use: Former  Substance Use Topics   Alcohol use: Not Currently    Comment: occasionally   Drug use: No    Family History  Problem Relation Age of Onset   Cancer Mother        breast   Heart disease Father 5   Cancer Paternal Aunt     No Known Allergies  Health Maintenance  Topic Date Due   COVID-19 Vaccine (3 - Pfizer risk series) 10/21/2019   TETANUS/TDAP  08/03/2021   INFLUENZA VACCINE  04/03/2022   HPV VACCINES  Completed   Hepatitis C Screening  Completed   HIV Screening  Completed    Objective:  Vitals:   05/24/22 0831  BP: (!) 144/76  Pulse: 71  Temp: 97.9 F (36.6 C)  TempSrc: Oral  SpO2: 98%  Weight: 219 lb (99.3 kg)  Height: 5\' 7"  (1.702 m)   Body mass index is 34.3 kg/m.  Physical Exam Constitutional:      Comments: He is talkative and in good spirits as usual.  His recorded weights are unchanged.  Cardiovascular:     Rate and Rhythm: Normal rate.  Pulmonary:     Effort: Pulmonary effort is normal.  Abdominal:     Palpations: Abdomen is soft.     Tenderness: There is no abdominal tenderness.     Comments: He has a quarter sized easily reducible umbilical hernia.  Psychiatric:        Mood and Affect:  Mood normal.     Lab Results Lab Results  Component Value Date   WBC 5.8 11/07/2021   HGB 16.5 11/07/2021   HCT 48.6 11/07/2021   MCV 95.3 11/07/2021   PLT 283 11/07/2021    Lab Results  Component Value Date   CREATININE 1.00 11/07/2021   BUN 17 11/07/2021   NA 136 11/07/2021   K 3.9 11/07/2021   CL 100 11/07/2021   CO2 27 11/07/2021    Lab Results  Component Value Date   ALT 28 11/07/2021   AST 21 11/07/2021   ALKPHOS 74 09/15/2020   BILITOT 0.7 11/07/2021    Lab Results  Component Value Date   CHOL 212 (H) 11/07/2021   HDL 39 (L) 11/07/2021   LDLCALC 131 (H) 11/07/2021   TRIG 270 (H) 11/07/2021   CHOLHDL 5.4 (H) 11/07/2021   Lab Results  Component Value Date   LABRPR NON-REACTIVE 11/07/2021   HIV 1 RNA Quant (Copies/mL)  Date Value  05/11/2022 Not Detected  11/07/2021 Not Detected  05/01/2021 <20 (H)   CD4 T Cell Abs (/uL)  Date Value  05/01/2021 649  03/23/2020 629  08/11/2019 578     Problem List Items Addressed This Visit       High   Human immunodeficiency virus (HIV) disease ([place])    His infection remains under excellent, long-term control.  He will continue Odefsey and follow-up after lab work in 1 year.  I told him that we currently have no therapies that are more effective, safer or simpler than Odefsey.      Relevant Medications   emtricitabine-rilpivir-tenofovir AF (ODEFSEY) 200-25-25 MG TABS tablet   Other Relevant Orders   CBC   T-helper cells (CD4) count (not at [facility])   Comprehensive metabolic panel   Lipid panel   RPR   HIV-1 RNA quant-no reflex-bld     Unprioritized   Dyslipidemia    He does have a family history of cardiovascular disease.  We will help him get a new, permanent PCP within [facility].      Umbilical hernia    I will make a referral for evaluation by general surgery.      Relevant Orders   Ambulatory referral to Syl[place], MD [facility] for [place] [phone] pager   [phone] cell 05/24/2022, 9:04 AM

## 2022-05-24 NOTE — Patient Instructions (Signed)
[facility], [place] [address], [place], [place], [place] [phone] [place], [place] [address] [place], [place], [phone] [facility], [place] [address], [address] [place], [place] [phone] [facility] (many locations): [email or web address]  Counseling Services: Counselor is available at [place] on Tuesdays [facility] the [facility] [employer] ([email or web address] [phone]

## 2022-06-12 ENCOUNTER — Other Ambulatory Visit (HOSPITAL_COMMUNITY): Payer: Self-pay

## 2022-06-14 ENCOUNTER — Other Ambulatory Visit (HOSPITAL_COMMUNITY): Payer: Self-pay

## 2022-06-19 ENCOUNTER — Other Ambulatory Visit (HOSPITAL_COMMUNITY): Payer: Self-pay

## 2022-07-10 ENCOUNTER — Other Ambulatory Visit (HOSPITAL_COMMUNITY): Payer: Self-pay

## 2022-07-12 ENCOUNTER — Other Ambulatory Visit (HOSPITAL_COMMUNITY): Payer: Self-pay

## 2022-07-16 ENCOUNTER — Other Ambulatory Visit (HOSPITAL_COMMUNITY): Payer: Self-pay

## 2022-08-08 ENCOUNTER — Other Ambulatory Visit (HOSPITAL_COMMUNITY): Payer: Self-pay

## 2022-08-10 ENCOUNTER — Other Ambulatory Visit (HOSPITAL_COMMUNITY): Payer: Self-pay

## 2022-08-15 ENCOUNTER — Other Ambulatory Visit: Payer: Self-pay

## 2022-08-17 ENCOUNTER — Other Ambulatory Visit (HOSPITAL_COMMUNITY): Payer: Self-pay

## 2022-09-04 ENCOUNTER — Other Ambulatory Visit (HOSPITAL_COMMUNITY): Payer: Self-pay

## 2022-09-06 ENCOUNTER — Other Ambulatory Visit (HOSPITAL_COMMUNITY): Payer: Self-pay

## 2022-09-10 ENCOUNTER — Other Ambulatory Visit (HOSPITAL_COMMUNITY): Payer: Self-pay

## 2022-09-10 ENCOUNTER — Other Ambulatory Visit: Payer: Self-pay

## 2022-09-13 ENCOUNTER — Other Ambulatory Visit (HOSPITAL_COMMUNITY): Payer: Self-pay

## 2022-10-03 ENCOUNTER — Other Ambulatory Visit (HOSPITAL_COMMUNITY): Payer: Self-pay

## 2022-10-05 ENCOUNTER — Other Ambulatory Visit (HOSPITAL_COMMUNITY): Payer: Self-pay

## 2022-10-08 ENCOUNTER — Other Ambulatory Visit: Payer: Self-pay

## 2022-11-06 ENCOUNTER — Other Ambulatory Visit (HOSPITAL_COMMUNITY): Payer: Self-pay

## 2022-11-06 ENCOUNTER — Other Ambulatory Visit: Payer: Self-pay

## 2022-11-27 ENCOUNTER — Other Ambulatory Visit (HOSPITAL_COMMUNITY): Payer: Self-pay

## 2022-11-29 ENCOUNTER — Other Ambulatory Visit (HOSPITAL_COMMUNITY): Payer: Self-pay

## 2022-12-03 ENCOUNTER — Other Ambulatory Visit (HOSPITAL_COMMUNITY): Payer: Self-pay

## 2022-12-06 ENCOUNTER — Other Ambulatory Visit (HOSPITAL_COMMUNITY): Payer: Self-pay

## 2023-01-01 ENCOUNTER — Other Ambulatory Visit (HOSPITAL_COMMUNITY): Payer: Self-pay

## 2023-01-04 ENCOUNTER — Other Ambulatory Visit (HOSPITAL_COMMUNITY): Payer: Self-pay

## 2023-01-07 ENCOUNTER — Other Ambulatory Visit (HOSPITAL_COMMUNITY): Payer: Self-pay

## 2023-01-07 ENCOUNTER — Other Ambulatory Visit: Payer: Self-pay

## 2023-01-29 ENCOUNTER — Other Ambulatory Visit (HOSPITAL_COMMUNITY): Payer: Self-pay

## 2023-01-31 ENCOUNTER — Other Ambulatory Visit (HOSPITAL_COMMUNITY): Payer: Self-pay

## 2023-02-18 ENCOUNTER — Other Ambulatory Visit: Payer: Self-pay

## 2023-02-20 ENCOUNTER — Other Ambulatory Visit (HOSPITAL_COMMUNITY): Payer: Self-pay

## 2023-02-26 ENCOUNTER — Other Ambulatory Visit (HOSPITAL_COMMUNITY): Payer: Self-pay

## 2023-02-27 ENCOUNTER — Other Ambulatory Visit: Payer: Self-pay

## 2023-03-18 ENCOUNTER — Other Ambulatory Visit: Payer: Self-pay

## 2023-03-21 ENCOUNTER — Other Ambulatory Visit (HOSPITAL_COMMUNITY): Payer: Self-pay

## 2023-03-25 ENCOUNTER — Other Ambulatory Visit: Payer: Self-pay

## 2023-03-25 ENCOUNTER — Other Ambulatory Visit (HOSPITAL_COMMUNITY): Payer: Self-pay

## 2023-03-26 ENCOUNTER — Other Ambulatory Visit (HOSPITAL_COMMUNITY): Payer: Self-pay

## 2023-04-03 ENCOUNTER — Other Ambulatory Visit: Payer: Self-pay

## 2023-04-03 ENCOUNTER — Encounter: Payer: Self-pay | Admitting: Internal Medicine

## 2023-04-03 ENCOUNTER — Other Ambulatory Visit (HOSPITAL_COMMUNITY): Payer: Self-pay

## 2023-04-03 DIAGNOSIS — B20 Human immunodeficiency virus [HIV] disease: Secondary | ICD-10-CM

## 2023-04-03 MED ORDER — ODEFSEY 200-25-25 MG PO TABS
1.0000 | ORAL_TABLET | Freq: Every day | ORAL | 1 refills | Status: DC
  Filled 2023-04-03 – 2023-04-17 (×3): qty 30, 30d supply, fill #0

## 2023-04-09 ENCOUNTER — Other Ambulatory Visit (HOSPITAL_COMMUNITY): Payer: Self-pay

## 2023-04-17 ENCOUNTER — Other Ambulatory Visit (HOSPITAL_COMMUNITY): Payer: Self-pay

## 2023-04-17 ENCOUNTER — Other Ambulatory Visit: Payer: Self-pay

## 2023-04-22 ENCOUNTER — Other Ambulatory Visit (HOSPITAL_COMMUNITY): Payer: Self-pay

## 2023-04-23 ENCOUNTER — Other Ambulatory Visit (HOSPITAL_COMMUNITY): Payer: Self-pay

## 2023-04-23 ENCOUNTER — Other Ambulatory Visit: Payer: Self-pay

## 2023-04-29 ENCOUNTER — Ambulatory Visit: Payer: Commercial Managed Care - PPO

## 2023-04-29 ENCOUNTER — Other Ambulatory Visit: Payer: Self-pay

## 2023-04-29 ENCOUNTER — Other Ambulatory Visit: Payer: Commercial Managed Care - PPO

## 2023-04-29 DIAGNOSIS — Z113 Encounter for screening for infections with a predominantly sexual mode of transmission: Secondary | ICD-10-CM | POA: Diagnosis not present

## 2023-04-29 DIAGNOSIS — B20 Human immunodeficiency virus [HIV] disease: Secondary | ICD-10-CM | POA: Diagnosis not present

## 2023-04-30 LAB — T-HELPER CELL (CD4) - (RCID CLINIC ONLY)
CD4 % Helper T Cell: 29 % — ABNORMAL LOW (ref 33–65)
CD4 T Cell Abs: 546 /uL (ref 400–1790)

## 2023-05-01 LAB — CBC WITH DIFFERENTIAL/PLATELET
Absolute Monocytes: 519 {cells}/uL (ref 200–950)
Basophils Absolute: 39 {cells}/uL (ref 0–200)
Basophils Relative: 0.8 %
Eosinophils Absolute: 49 {cells}/uL (ref 15–500)
Eosinophils Relative: 1 %
HCT: 49 % (ref 38.5–50.0)
Hemoglobin: 16.9 g/dL (ref 13.2–17.1)
Lymphs Abs: 1994 {cells}/uL (ref 850–3900)
MCH: 32.6 pg (ref 27.0–33.0)
MCHC: 34.5 g/dL (ref 32.0–36.0)
MCV: 94.6 fL (ref 80.0–100.0)
MPV: 10.5 fL (ref 7.5–12.5)
Monocytes Relative: 10.6 %
Neutro Abs: 2298 {cells}/uL (ref 1500–7800)
Neutrophils Relative %: 46.9 %
Platelets: 278 10*3/uL (ref 140–400)
RBC: 5.18 10*6/uL (ref 4.20–5.80)
RDW: 12.2 % (ref 11.0–15.0)
Total Lymphocyte: 40.7 %
WBC: 4.9 10*3/uL (ref 3.8–10.8)

## 2023-05-01 LAB — COMPLETE METABOLIC PANEL WITH GFR
AG Ratio: 1.4 (calc) (ref 1.0–2.5)
ALT: 30 U/L (ref 9–46)
AST: 20 U/L (ref 10–40)
Albumin: 4.4 g/dL (ref 3.6–5.1)
Alkaline phosphatase (APISO): 67 U/L (ref 36–130)
BUN: 17 mg/dL (ref 7–25)
CO2: 28 mmol/L (ref 20–32)
Calcium: 10.4 mg/dL — ABNORMAL HIGH (ref 8.6–10.3)
Chloride: 101 mmol/L (ref 98–110)
Creat: 0.94 mg/dL (ref 0.60–1.29)
Globulin: 3.1 g/dL (calc) (ref 1.9–3.7)
Glucose, Bld: 86 mg/dL (ref 65–99)
Potassium: 4.4 mmol/L (ref 3.5–5.3)
Sodium: 137 mmol/L (ref 135–146)
Total Bilirubin: 0.7 mg/dL (ref 0.2–1.2)
Total Protein: 7.5 g/dL (ref 6.1–8.1)
eGFR: 103 mL/min/{1.73_m2} (ref >=60)

## 2023-05-01 LAB — LIPID PANEL
Cholesterol: 242 mg/dL — ABNORMAL HIGH (ref <200)
HDL: 40 mg/dL (ref >=40)
LDL Cholesterol (Calc): 153 mg/dL — ABNORMAL HIGH
Non-HDL Cholesterol (Calc): 202 mg/dL (calc) — ABNORMAL HIGH (ref <130)
Total CHOL/HDL Ratio: 6.1 (calc) — ABNORMAL HIGH (ref <5.0)
Triglycerides: 319 mg/dL — ABNORMAL HIGH (ref <150)

## 2023-05-01 LAB — HIV-1 RNA QUANT-NO REFLEX-BLD
HIV 1 RNA Quant: <20 {copies}/mL — ABNORMAL HIGH
HIV-1 RNA Quant, Log: <1.3 {Log_copies}/mL — ABNORMAL HIGH

## 2023-05-01 LAB — RPR: RPR Ser Ql: NONREACTIVE

## 2023-05-13 ENCOUNTER — Other Ambulatory Visit: Payer: Self-pay

## 2023-05-13 ENCOUNTER — Ambulatory Visit (INDEPENDENT_AMBULATORY_CARE_PROVIDER_SITE_OTHER): Payer: Commercial Managed Care - PPO | Admitting: Internal Medicine

## 2023-05-13 ENCOUNTER — Other Ambulatory Visit (HOSPITAL_COMMUNITY): Payer: Self-pay

## 2023-05-13 ENCOUNTER — Encounter: Payer: Self-pay | Admitting: Internal Medicine

## 2023-05-13 VITALS — BP 148/86 | HR 69 | Temp 98.6°F | Wt 221.0 lb

## 2023-05-13 DIAGNOSIS — B20 Human immunodeficiency virus [HIV] disease: Secondary | ICD-10-CM | POA: Diagnosis not present

## 2023-05-13 DIAGNOSIS — Z23 Encounter for immunization: Secondary | ICD-10-CM

## 2023-05-13 MED ORDER — ODEFSEY 200-25-25 MG PO TABS
1.0000 | ORAL_TABLET | Freq: Every day | ORAL | 11 refills | Status: DC
  Filled 2023-05-13 – 2023-05-14 (×2): qty 30, 30d supply, fill #0
  Filled 2023-06-17: qty 30, 30d supply, fill #1
  Filled 2023-07-09: qty 30, 30d supply, fill #2
  Filled 2023-08-07: qty 30, 30d supply, fill #3
  Filled 2023-09-05: qty 30, 30d supply, fill #4
  Filled 2023-10-01: qty 30, 30d supply, fill #5
  Filled 2023-11-04: qty 30, 30d supply, fill #6

## 2023-05-13 NOTE — Progress Notes (Signed)
RFV: annual follow up for hiv disease  Patient ID: Kevin Keller, male   DOB: [birth year], 42 y.o.   MRN: [id number]  HPI 42yo M with well controlled hiv disease, cd 546/LV<20 on odefsey. No issues with taking meds Moved down from [place] 78yrs ago.  Dx in 2001,but didn't start treatment 2012.  Works on 4E and ED for [employer]  Works out -- exercises routinely, and watches his stpes via fitbit  Previously was at higher weight, at 300# and has noticed improvement in musculature and   Soc hx: his husband found his mother passed away in the night,- 2 wks ago. Still feels like he is grieving from the loss.  Also takes mvc, vit c, collagen, fiber supplement  Outpatient Encounter Medications as of 05/13/2023  Medication Sig   emtricitabine-rilpivir-tenofovir AF (ODEFSEY) 200-25-25 MG TABS tablet Take 1 tablet by mouth daily.   amphetamine-dextroamphetamine (ADDERALL XR) 20 MG 24 hr capsule Take 1 capsule by mouth daily. (Patient not taking: Reported on 05/24/2022)   cyclobenzaprine (FLEXERIL) 5 MG tablet Take 1 tablet (5 mg total) by mouth at bedtime as needed for muscle spasms (Patient not taking: Reported on 05/24/2022)   loratadine (CLARITIN) 10 MG tablet Take 10 mg by mouth daily. Reported on 01/24/2016 (Patient not taking: Reported on 05/16/2021)   No facility-administered encounter medications on file as of 05/13/2023.     Patient Active Problem List   Diagnosis Date Noted   Health care maintenance 09/20/2021   Mass of subcutaneous tissue of back 11/24/2020   Right low back pain 10/20/2020   Attention deficit hyperactivity disorder (ADHD), predominantly hyperactive type 10/07/2019   Scrotal swelling 10/07/2019   Umbilical hernia 11/10/2018   Tinea cruris 11/20/2017   Obesity 01/17/2017   Dyslipidemia 01/24/2016   History of genital warts 07/06/2014   History of chlamydia infection 07/06/2014   H/O seasonal allergies 07/05/2014   Human immunodeficiency virus (HIV) disease  (HCC) 05/07/2014     Health Maintenance Due  Topic Date Due   COVID-19 Vaccine (3 - Pfizer risk series) 10/21/2019   DTaP/Tdap/Td (2 - Td or Tdap) 08/03/2021   INFLUENZA VACCINE  04/04/2023     Review of Systems Review of Systems  Constitutional: Negative for fever, chills, diaphoresis, activity change, appetite change, fatigue and unexpected weight change.  HENT: Negative for congestion, sore throat, rhinorrhea, sneezing, trouble swallowing and sinus pressure.  Eyes: Negative for photophobia and visual disturbance.  Respiratory: Negative for cough, chest tightness, shortness of breath, wheezing and stridor.  Cardiovascular: Negative for chest pain, palpitations and leg swelling.  Gastrointestinal: Negative for nausea, vomiting, abdominal pain, diarrhea, constipation, blood in stool, abdominal distention and anal bleeding.  Genitourinary: Negative for dysuria, hematuria, flank pain and difficulty urinating.  Musculoskeletal: Negative for myalgias, back pain, joint swelling, arthralgias and gait problem.  Skin: Negative for color change, pallor, rash and wound.  Neurological: Negative for dizziness, tremors, weakness and light-headedness.  Hematological: Negative for adenopathy. Does not bruise/bleed easily.  Psychiatric/Behavioral: Negative for behavioral problems, confusion, sleep disturbance, dysphoric mood, decreased concentration and agitation.   Physical Exam   BP (!) 151/87   Temp 98.6 F (37 C) (Oral)   Wt 221 lb (100.2 kg)   BMI 34.61 kg/m   Physical Exam  Constitutional: He is oriented to person, place, and time. He appears well-developed and well-nourished. No distress.  HENT:  Mouth/Throat: Oropharynx is clear and moist. No oropharyngeal exudate.  Cardiovascular: Normal rate, regular rhythm and normal heart sounds. Exam reveals  no gallop and no friction rub.  No murmur heard.  Pulmonary/Chest: Effort normal and breath sounds normal. No respiratory distress. He has  no wheezes.  Abdominal: Soft. Bowel sounds are normal. He exhibits no distension. There is no tenderness. Small umbilical hernia. Non tender Lymphadenopathy:  He has no cervical adenopathy.  Neurological: He is alert and oriented to person, place, and time.  Skin: Skin is warm and dry. No rash noted. No erythema.  Psychiatric: He has a normal mood and affect. His behavior is normal.   Lab Results  Component Value Date   CD4TCELL 29 (L) 04/29/2023   Lab Results  Component Value Date   CD4TABS 546 04/29/2023   CD4TABS 649 05/01/2021   CD4TABS 629 03/23/2020   Lab Results  Component Value Date   HIV1RNAQUANT <20 (H) 04/29/2023   Lab Results  Component Value Date   HEPBSAB POS (A) 01/10/2016   Lab Results  Component Value Date   LABRPR NON-REACTIVE 04/29/2023    CBC Lab Results  Component Value Date   WBC 4.9 04/29/2023   RBC 5.18 04/29/2023   HGB 16.9 04/29/2023   HCT 49.0 04/29/2023   PLT 278 04/29/2023   MCV 94.6 04/29/2023   MCH 32.6 04/29/2023   MCHC 34.5 04/29/2023   RDW 12.2 04/29/2023   LYMPHSABS 1,994 04/29/2023   MONOABS 0.4 06/10/2014   EOSABS 49 04/29/2023    BMET Lab Results  Component Value Date   NA 137 04/29/2023   K 4.4 04/29/2023   CL 101 04/29/2023   CO2 28 04/29/2023   GLUCOSE 86 04/29/2023   BUN 17 04/29/2023   CREATININE 0.94 04/29/2023   CALCIUM 10.4 (H) 04/29/2023   GFRNONAA 100 09/15/2020   GFRAA 115 09/15/2020      Assessment and Plan  Hiv disease= well controlled. Continue with odefsey  Long term medication management = cr  is stable  Health maintenance = will give flu vaccine; give letter to [facility] for proof of vaccine  Repeat bp/pre-hypertension = will check again at next visit to see if need for meds  Dyslipidemia = continue with diet and exercise  See back in 6 month/ with repeat labs in 6 months  I have personally spent 35 minutes involved in face-to-face and non-face-to-face activities for this patient on the day  of the visit. Professional time spent includes the following activities: Preparing to see the patient (review of tests), Obtaining and/or reviewing separately obtained history (admission/discharge record), Performing a medically appropriate examination and/or evaluation , Ordering medications/tests/procedures, referring and communicating with other health care professionals, Documenting clinical information in the EMR, Independently interpreting results (not separately reported), Communicating results to the patient/family/caregiver, Counseling and educating the patient/family/caregiver and Care coordination (not separately reported).

## 2023-05-14 ENCOUNTER — Other Ambulatory Visit (HOSPITAL_COMMUNITY): Payer: Self-pay

## 2023-05-14 ENCOUNTER — Other Ambulatory Visit: Payer: Self-pay

## 2023-05-22 ENCOUNTER — Other Ambulatory Visit: Payer: Self-pay

## 2023-05-22 ENCOUNTER — Other Ambulatory Visit (HOSPITAL_COMMUNITY): Payer: Self-pay

## 2023-05-25 ENCOUNTER — Encounter (HOSPITAL_COMMUNITY): Payer: Self-pay

## 2023-06-13 ENCOUNTER — Other Ambulatory Visit (HOSPITAL_COMMUNITY): Payer: Self-pay

## 2023-06-17 ENCOUNTER — Other Ambulatory Visit: Payer: Self-pay

## 2023-06-17 ENCOUNTER — Encounter: Payer: Self-pay | Admitting: Internal Medicine

## 2023-06-17 NOTE — Progress Notes (Signed)
Specialty Pharmacy Refill Coordination Note  Kevin Keller is a 42 y.o. male contacted today regarding refills of specialty medication(s) Emtricitab-Rilpivir-Tenofov Af   Patient requested [name] at [facility] at [place] date: 06/19/23   Medication will be filled on 06/18/23.

## 2023-06-18 ENCOUNTER — Other Ambulatory Visit: Payer: Self-pay

## 2023-06-19 ENCOUNTER — Other Ambulatory Visit (HOSPITAL_COMMUNITY): Payer: Self-pay

## 2023-07-05 ENCOUNTER — Other Ambulatory Visit (HOSPITAL_COMMUNITY): Payer: Self-pay

## 2023-07-09 ENCOUNTER — Other Ambulatory Visit: Payer: Self-pay

## 2023-07-09 ENCOUNTER — Other Ambulatory Visit (HOSPITAL_COMMUNITY): Payer: Self-pay

## 2023-07-09 NOTE — Progress Notes (Signed)
Specialty Pharmacy Refill Coordination Note  Kevin Keller is a 42 y.o. male contacted today regarding refills of specialty medication(s) Emtricitab-Rilpivir-Tenofov Af   Patient requested [name] at [facility] at [place] date: 07/17/23   Medication will be filled on 07/16/23.

## 2023-07-17 ENCOUNTER — Other Ambulatory Visit (HOSPITAL_COMMUNITY): Payer: Self-pay

## 2023-08-07 ENCOUNTER — Encounter (HOSPITAL_COMMUNITY): Payer: Self-pay

## 2023-08-07 ENCOUNTER — Other Ambulatory Visit: Payer: Self-pay

## 2023-08-07 NOTE — Progress Notes (Signed)
Specialty Pharmacy Refill Coordination Note  Kevin Keller is a 42 y.o. male contacted today regarding refills of specialty medication(s) Emtricitab-Rilpivir-Tenofov Af   Patient requested [name] at [facility] at [place] date: 08/13/23   Medication will be filled on 08/13/23.

## 2023-08-07 NOTE — Progress Notes (Signed)
Specialty Pharmacy Ongoing Clinical Assessment Note  Kevin Keller is a 42 y.o. male who is being followed by the specialty pharmacy service for RxSp HIV   Patient's specialty medication(s) reviewed today: Emtricitab-Rilpivir-Tenofov Af   Missed doses in the last 4 weeks: 0   Patient/Caregiver did not have any additional questions or concerns.   Therapeutic benefit summary: Patient is achieving benefit   Adverse events/side effects summary: No adverse events/side effects   Patient's therapy is appropriate to: Continue    Goals Addressed             This Visit's Progress    Achieve Undetectable HIV Viral Load < 20       Patient is on track. Patient will maintain adherence         Follow up:  6 months  Bobette Mo Specialty Pharmacist

## 2023-08-13 ENCOUNTER — Other Ambulatory Visit: Payer: Self-pay

## 2023-09-02 ENCOUNTER — Other Ambulatory Visit (HOSPITAL_COMMUNITY): Payer: Self-pay

## 2023-09-05 ENCOUNTER — Encounter (HOSPITAL_COMMUNITY): Payer: Self-pay

## 2023-09-05 ENCOUNTER — Other Ambulatory Visit: Payer: Self-pay

## 2023-09-05 ENCOUNTER — Other Ambulatory Visit (HOSPITAL_COMMUNITY): Payer: Self-pay

## 2023-09-05 NOTE — Progress Notes (Signed)
 Specialty Pharmacy Refill Coordination Note  Kevin Keller is a 43 y.o. male contacted today regarding refills of specialty medication(s) Emtricitab-Rilpivir-Tenofov AF (Odefsey )   Patient requested [name] at [facility] at [place] date: 09/11/23   Medication will be filled on 09/10/23.

## 2023-09-10 ENCOUNTER — Other Ambulatory Visit: Payer: Self-pay

## 2023-09-11 ENCOUNTER — Other Ambulatory Visit (HOSPITAL_COMMUNITY): Payer: Self-pay

## 2023-10-01 ENCOUNTER — Other Ambulatory Visit: Payer: Self-pay

## 2023-10-01 NOTE — Progress Notes (Signed)
Specialty Pharmacy Refill Coordination Note  Kevin Keller is a 43 y.o. male contacted today regarding refills of specialty medication(s) Emtricitab-Rilpivir-Tenofov AF [name])   Patient requested [name] at [facility] at [place] date: 10/09/23   Medication will be filled on 02.04.25.

## 2023-10-08 ENCOUNTER — Other Ambulatory Visit: Payer: Self-pay

## 2023-10-31 ENCOUNTER — Other Ambulatory Visit: Payer: Self-pay

## 2023-11-04 ENCOUNTER — Other Ambulatory Visit (HOSPITAL_COMMUNITY): Payer: Self-pay

## 2023-11-04 ENCOUNTER — Other Ambulatory Visit: Payer: Self-pay

## 2023-11-04 NOTE — Progress Notes (Signed)
 Specialty Pharmacy Refill Coordination Note  Kevin Keller is a 43 y.o. male contacted today regarding refills of specialty medication(s) Emtricitab-Rilpivir-Tenofov AF [name])   Patient requested [name] at [facility] at [place] date: 11/06/23   Medication will be filled on 11/05/23.

## 2023-11-05 ENCOUNTER — Other Ambulatory Visit: Payer: Self-pay

## 2023-11-05 DIAGNOSIS — Z79899 Other long term (current) drug therapy: Secondary | ICD-10-CM

## 2023-11-05 DIAGNOSIS — Z113 Encounter for screening for infections with a predominantly sexual mode of transmission: Secondary | ICD-10-CM

## 2023-11-05 DIAGNOSIS — B20 Human immunodeficiency virus [HIV] disease: Secondary | ICD-10-CM

## 2023-11-11 ENCOUNTER — Other Ambulatory Visit: Payer: 59

## 2023-11-11 ENCOUNTER — Other Ambulatory Visit: Payer: Self-pay

## 2023-11-11 ENCOUNTER — Other Ambulatory Visit

## 2023-11-11 DIAGNOSIS — B20 Human immunodeficiency virus [HIV] disease: Secondary | ICD-10-CM | POA: Diagnosis not present

## 2023-11-11 DIAGNOSIS — Z113 Encounter for screening for infections with a predominantly sexual mode of transmission: Secondary | ICD-10-CM

## 2023-11-12 LAB — T-HELPER CELL (CD4) - (RCID CLINIC ONLY)
CD4 % Helper T Cell: 28 % — ABNORMAL LOW (ref 33–65)
CD4 T Cell Abs: 534 /uL (ref 400–1790)

## 2023-11-15 LAB — HIV-1 RNA QUANT-NO REFLEX-BLD
HIV 1 RNA Quant: 20 {copies}/mL — ABNORMAL HIGH
HIV-1 RNA Quant, Log: 1.31 {Log_copies}/mL — ABNORMAL HIGH

## 2023-11-15 LAB — COMPLETE METABOLIC PANEL WITH GFR
AG Ratio: 1.5 (calc) (ref 1.0–2.5)
ALT: 34 U/L (ref 9–46)
AST: 22 U/L (ref 10–40)
Albumin: 4.5 g/dL (ref 3.6–5.1)
Alkaline phosphatase (APISO): 67 U/L (ref 36–130)
BUN: 20 mg/dL (ref 7–25)
CO2: 25 mmol/L (ref 20–32)
Calcium: 9.7 mg/dL (ref 8.6–10.3)
Chloride: 104 mmol/L (ref 98–110)
Creat: 0.85 mg/dL (ref 0.60–1.29)
Globulin: 3.1 g/dL (ref 1.9–3.7)
Glucose, Bld: 98 mg/dL (ref 65–99)
Potassium: 4 mmol/L (ref 3.5–5.3)
Sodium: 138 mmol/L (ref 135–146)
Total Bilirubin: 0.9 mg/dL (ref 0.2–1.2)
Total Protein: 7.6 g/dL (ref 6.1–8.1)
eGFR: 111 mL/min/{1.73_m2} (ref >=60)

## 2023-11-15 LAB — CBC WITH DIFFERENTIAL/PLATELET
Absolute Lymphocytes: 2200 {cells}/uL (ref 850–3900)
Absolute Monocytes: 578 {cells}/uL (ref 200–950)
Basophils Absolute: 32 {cells}/uL (ref 0–200)
Basophils Relative: 0.6 %
Eosinophils Absolute: 69 {cells}/uL (ref 15–500)
Eosinophils Relative: 1.3 %
HCT: 49.9 % (ref 38.5–50.0)
Hemoglobin: 17 g/dL (ref 13.2–17.1)
MCH: 32.1 pg (ref 27.0–33.0)
MCHC: 34.1 g/dL (ref 32.0–36.0)
MCV: 94.3 fL (ref 80.0–100.0)
MPV: 10.3 fL (ref 7.5–12.5)
Monocytes Relative: 10.9 %
Neutro Abs: 2422 {cells}/uL (ref 1500–7800)
Neutrophils Relative %: 45.7 %
Platelets: 298 10*3/uL (ref 140–400)
RBC: 5.29 10*6/uL (ref 4.20–5.80)
RDW: 12 % (ref 11.0–15.0)
Total Lymphocyte: 41.5 %
WBC: 5.3 10*3/uL (ref 3.8–10.8)

## 2023-11-15 LAB — RPR: RPR Ser Ql: NONREACTIVE

## 2023-11-25 ENCOUNTER — Other Ambulatory Visit: Payer: Self-pay

## 2023-11-25 ENCOUNTER — Other Ambulatory Visit (HOSPITAL_COMMUNITY): Payer: Self-pay

## 2023-11-25 ENCOUNTER — Encounter: Payer: Self-pay | Admitting: Internal Medicine

## 2023-11-25 ENCOUNTER — Ambulatory Visit (INDEPENDENT_AMBULATORY_CARE_PROVIDER_SITE_OTHER): Payer: Commercial Managed Care - PPO | Admitting: Internal Medicine

## 2023-11-25 VITALS — BP 133/79 | HR 76 | Resp 16 | Ht 67.0 in | Wt 223.9 lb

## 2023-11-25 DIAGNOSIS — B20 Human immunodeficiency virus [HIV] disease: Secondary | ICD-10-CM

## 2023-11-25 DIAGNOSIS — K429 Umbilical hernia without obstruction or gangrene: Secondary | ICD-10-CM

## 2023-11-25 DIAGNOSIS — E782 Mixed hyperlipidemia: Secondary | ICD-10-CM | POA: Diagnosis not present

## 2023-11-25 DIAGNOSIS — B354 Tinea corporis: Secondary | ICD-10-CM | POA: Diagnosis not present

## 2023-11-25 DIAGNOSIS — Z79899 Other long term (current) drug therapy: Secondary | ICD-10-CM

## 2023-11-25 MED ORDER — ODEFSEY 200-25-25 MG PO TABS
1.0000 | ORAL_TABLET | Freq: Every day | ORAL | 11 refills | Status: AC
  Filled 2023-11-25 – 2023-12-11 (×3): qty 30, 30d supply, fill #0
  Filled 2024-01-07: qty 30, 30d supply, fill #1
  Filled 2024-01-31: qty 30, 30d supply, fill #2
  Filled 2024-02-27 (×2): qty 30, 30d supply, fill #3
  Filled 2024-04-02: qty 30, 30d supply, fill #4
  Filled 2024-04-27: qty 30, 30d supply, fill #5
  Filled 2024-05-25: qty 30, 30d supply, fill #6
  Filled 2024-06-22: qty 30, 30d supply, fill #7
  Filled 2024-07-15 – 2024-07-20 (×2): qty 30, 30d supply, fill #8
  Filled 2024-08-14: qty 30, 30d supply, fill #9
  Filled 2024-09-09 (×2): qty 30, 30d supply, fill #10

## 2023-11-25 MED ORDER — CLOTRIMAZOLE 1 % EX CREA
TOPICAL_CREAM | Freq: Every day | CUTANEOUS | 1 refills | Status: DC
Start: 2023-11-25 — End: 2024-05-28
  Filled 2023-11-25: qty 56, 7d supply, fill #0
  Filled 2023-12-26: qty 60, 30d supply, fill #1

## 2023-11-25 MED ORDER — CLOTRIMAZOLE 1 % EX CREA: TOPICAL_CREAM | Freq: Every day | CUTANEOUS | 1 refills | Status: DC

## 2023-11-25 NOTE — Progress Notes (Unsigned)
 Patient ID: Kevin Keller, male   DOB: 04-Jul-1981, 43 y.o.   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 -- joy who does 4E - has good relationship. He also signs up to do more shifts at various EDs like [employer]; [employer].   Did certification for data analysis, -- looking for a new job to see if can do a different job.   Previously followed with dr Orvan Falconer.   Outpatient Encounter Medications as of 11/25/2023  Medication Sig   emtricitabine-rilpivir-tenofovir AF (ODEFSEY) 200-25-25 MG TABS tablet Take 1 tablet by mouth daily.   amphetamine-dextroamphetamine (ADDERALL XR) 20 MG 24 hr capsule Take 1 capsule by mouth daily. (Patient not taking: Reported on 11/25/2023)   cyclobenzaprine (FLEXERIL) 5 MG tablet Take 1 tablet (5 mg total) by mouth at bedtime as needed for muscle spasms (Patient not taking: Reported on 11/25/2023)   loratadine (CLARITIN) 10 MG tablet Take 10 mg by mouth daily. Reported on 01/24/2016 (Patient not taking: Reported on 11/25/2023)   No facility-administered encounter medications on file as of 11/25/2023.     Patient Active Problem List   Diagnosis Date Noted   Health care maintenance 09/20/2021   Mass of subcutaneous tissue of back 11/24/2020   Right low back pain 10/20/2020   Attention deficit hyperactivity disorder (ADHD), predominantly hyperactive type 10/07/2019   Scrotal swelling 10/07/2019   Umbilical hernia 11/10/2018   Tinea cruris 11/20/2017   Obesity 01/17/2017   Dyslipidemia 01/24/2016   History of genital warts 07/06/2014   History of chlamydia infection 07/06/2014   H/O seasonal allergies 07/05/2014   Human immunodeficiency virus (HIV) disease (HCC) 05/07/2014     Health Maintenance Due  Topic Date Due   COVID-19 Vaccine (3 - Pfizer risk series) 10/21/2019   DTaP/Tdap/Td (2 -  Td or Tdap) 08/03/2021     Review of Systems 12 point ros is negative Physical Exam   BP 133/79   Pulse 76   Resp 16   Ht 5\' 7"  (1.702 m)   Wt 223 lb 14.4 oz (101.6 kg)   BMI 35.07 kg/m   Physical Exam  Constitutional: He is oriented to person, place, and time. He appears well-developed and well-nourished. No distress.  HENT:  Mouth/Throat: Oropharynx is clear and moist. No oropharyngeal exudate.  Cardiovascular: Normal rate, regular rhythm and normal heart sounds. Exam reveals no gallop and no friction rub.  No murmur heard.  Pulmonary/Chest: Effort normal and breath sounds normal. No respiratory distress. He has no wheezes.  Abdominal: Soft. Bowel sounds are normal. He exhibits no distension. There is no tenderness.  Lymphadenopathy:  He has no cervical adenopathy.  Neurological: He is alert and oriented to person, place, and time.  Skin: Skin is warm and dry. No rash noted. No erythema.  Psychiatric: He has a normal mood and affect. His behavior is normal.   Lab Results  Component Value Date   CD4TCELL 28 (L) 11/11/2023   Lab Results  Component Value Date   CD4TABS 534 11/11/2023   CD4TABS 546 04/29/2023   CD4TABS 649 05/01/2021   Lab Results  Component Value Date   HIV1RNAQUANT 20 (H) 11/11/2023   Lab Results  Component Value Date   HEPBSAB POS (A) 01/10/2016   Lab Results  Component Value Date   LABRPR NON-REACTIVE  11/11/2023    CBC Lab Results  Component Value Date   WBC 5.3 11/11/2023   RBC 5.29 11/11/2023   HGB 17.0 11/11/2023   HCT 49.9 11/11/2023   PLT 298 11/11/2023   MCV 94.3 11/11/2023   MCH 32.1 11/11/2023   MCHC 34.1 11/11/2023   RDW 12.0 11/11/2023   LYMPHSABS 1,994 04/29/2023   MONOABS 0.4 06/10/2014   EOSABS 69 11/11/2023    BMET Lab Results  Component Value Date   NA 138 11/11/2023   K 4.0 11/11/2023   CL 104 11/11/2023   CO2 25 11/11/2023   GLUCOSE 98 11/11/2023   BUN 20 11/11/2023   CREATININE 0.85 11/11/2023   CALCIUM  9.7 11/11/2023   GFRNONAA 100 09/15/2020   GFRAA 115 09/15/2020      Assessment and Plan  Pcv 20?  - at next visit   HPV immune  Hyperlipidemia/hypercholesteremia = will get a fasting lipid profile to see if need  --   Umbilical hernia =  dr Susy Frizzle tsui...and imaging  Left inner thigh lesion/tinea = lesion. Suspect its candidal..Marland Kitchen

## 2023-11-26 ENCOUNTER — Other Ambulatory Visit: Payer: Self-pay

## 2023-11-28 ENCOUNTER — Other Ambulatory Visit: Payer: Self-pay | Admitting: Pharmacy Technician

## 2023-11-28 ENCOUNTER — Other Ambulatory Visit: Payer: Self-pay

## 2023-11-28 ENCOUNTER — Other Ambulatory Visit (HOSPITAL_COMMUNITY): Payer: Self-pay

## 2023-11-28 NOTE — Progress Notes (Signed)
 Specialty Pharmacy Refill Coordination Note  Kevin Keller is a 43 y.o. male contacted today regarding refills of specialty medication(s) Emtricitab-Rilpivir-Tenofov AF [name])   Patient requested [name] at [facility] at [place] date: 12/05/23   Medication will be filled on 12/04/23.

## 2023-12-04 ENCOUNTER — Other Ambulatory Visit (HOSPITAL_COMMUNITY): Payer: Self-pay

## 2023-12-04 ENCOUNTER — Other Ambulatory Visit: Payer: Self-pay

## 2023-12-05 ENCOUNTER — Other Ambulatory Visit: Payer: Self-pay

## 2023-12-06 ENCOUNTER — Other Ambulatory Visit: Payer: Self-pay

## 2023-12-10 ENCOUNTER — Other Ambulatory Visit (HOSPITAL_COMMUNITY): Payer: Self-pay

## 2023-12-10 ENCOUNTER — Other Ambulatory Visit: Payer: Self-pay

## 2023-12-11 ENCOUNTER — Other Ambulatory Visit (HOSPITAL_COMMUNITY): Payer: Self-pay

## 2023-12-11 ENCOUNTER — Other Ambulatory Visit: Payer: Self-pay

## 2023-12-11 NOTE — Progress Notes (Signed)
 Will AR copay [phone]. Follow up with Rocky Link next month before proceeding with another AR

## 2023-12-13 ENCOUNTER — Other Ambulatory Visit (HOSPITAL_COMMUNITY): Payer: Self-pay

## 2023-12-24 ENCOUNTER — Other Ambulatory Visit: Payer: Self-pay

## 2023-12-26 ENCOUNTER — Other Ambulatory Visit (HOSPITAL_COMMUNITY): Payer: Self-pay

## 2024-01-01 ENCOUNTER — Other Ambulatory Visit: Payer: Self-pay

## 2024-01-03 ENCOUNTER — Other Ambulatory Visit: Payer: Self-pay | Admitting: Surgery

## 2024-01-03 ENCOUNTER — Ambulatory Visit: Payer: Self-pay | Admitting: Surgery

## 2024-01-03 DIAGNOSIS — K429 Umbilical hernia without obstruction or gangrene: Secondary | ICD-10-CM | POA: Diagnosis not present

## 2024-01-03 DIAGNOSIS — R110 Nausea: Secondary | ICD-10-CM

## 2024-01-03 NOTE — H&P (Signed)
 Subjective    Chief Complaint: New Consultation (Umbilical hernia)       History of Present Illness: Kevin Keller is a 43 y.o. male who is seen today as an office consultation at the request of Dr. Levern Reader for evaluation of New Consultation (Umbilical hernia) .     This is a 43 year old male who presents with a 7-year history of a slowly enlarging umbilical hernia.  It has caused some discomfort.  The patient also reports fairly frequent postprandial nausea.  He does not really have pain associated with these nausea symptoms.  He has not had any imaging.     Review of Systems: A complete review of systems was obtained from the patient.  I have reviewed this information and discussed as appropriate with the patient.  See HPI as well for other ROS.   Review of Systems  Constitutional: Negative.   HENT: Negative.    Eyes: Negative.   Respiratory: Negative.    Cardiovascular: Negative.   Gastrointestinal:  Positive for abdominal pain and nausea.  Genitourinary: Negative.   Musculoskeletal: Negative.   Skin: Negative.   Neurological: Negative.   Endo/Heme/Allergies: Negative.   Psychiatric/Behavioral: Negative.          Medical History:     Problem List     Patient Active Problem List  Diagnosis   Postprandial nausea   Umbilical hernia without obstruction or gangrene   Obesity   Dyslipidemia   Attention deficit hyperactivity disorder (ADHD), predominantly hyperactive type   HIV (human immunodeficiency virus infection) (CMS/HHS-HCC)        Past Surgical History       Past Surgical History:  Procedure Laterality Date   REPAIR INITIAL INGUINAL HERNIA PRETERM INFANT W/WO HYDROCELECTOMY            Allergies  No Known Allergies     Medications Ordered Prior to Encounter        Current Outpatient Medications on File Prior to Visit  Medication Sig Dispense Refill   ascorbic acid (VITA-C ORAL) Take by mouth       cyanocobalamin/cobamamide (B12 SL) Place under the  tongue       docosahexaenoic acid/epa (FISH OIL ORAL) Take by mouth       multivit-min/ferrous fumarate (MULTI VITAMIN ORAL) Take by mouth       ODEFSEY  200-25-25 mg tablet Take 1 tablet by mouth once daily        No current facility-administered medications on file prior to visit.        Family History       Family History  Problem Relation Age of Onset   Breast cancer Mother     Coronary Artery Disease (Blocked arteries around heart) Father          Tobacco Use History  Social History        Tobacco Use  Smoking Status Former   Types: Cigarettes  Smokeless Tobacco Never        Social History  Social History         Socioeconomic History   Marital status: Single  Tobacco Use   Smoking status: Former      Types: Cigarettes   Smokeless tobacco: Never  Vaping Use   Vaping status: Never Used  Substance and Sexual Activity   Alcohol use: Yes   Drug use: Never    Social Drivers of Health        Housing Stability: Unknown (01/03/2024)    Housing Stability Vital Sign  Homeless in the Last Year: No        Objective:          Vitals:    01/03/24 1048 01/03/24 1049  BP: (!) 157/90    Pulse: 88    Temp: 37 C (98.6 F)    SpO2: 97%    Weight: 100 kg (220 lb 6.4 oz)    Height: 170.2 cm (5\' 7" )    PainSc:   0-No pain  PainLoc:   Umbilicus    Body mass index is 34.52 kg/m.   Physical Exam    Constitutional:  WDWN in NAD, conversant, no obvious deformities; lying in bed comfortably Eyes:  Pupils equal, round; sclera anicteric; moist conjunctiva; no lid lag HENT:  Oral mucosa moist; good dentition  Neck:  No masses palpated, trachea midline; no thyromegaly Lungs:  CTA bilaterally; normal respiratory effort Breasts:  symmetric, no nipple changes; no palpable masses or lymphadenopathy on either side CV:  Regular rate and rhythm; no murmurs; extremities well-perfused with no edema Abd:  +bowel sounds, soft, non-tender, no palpable organomegaly; upper  midline rectus diastases.  Patient has a protruding umbilical hernia with a 2 cm palpable fascial defect.  The hernia is easily reducible. Musc:  Unable to assess gait; no apparent clubbing or cyanosis in extremities Lymphatic:  No palpable cervical or axillary lymphadenopathy Skin:  Warm, dry; no sign of jaundice Psychiatric - alert and oriented x 4; calm mood and affect       Assessment and Plan:  Diagnoses and all orders for this visit:   Umbilical hernia without obstruction or gangrene   Postprandial nausea     The patient has an obvious umbilical hernia that should be repaired.  However, prior to proceeding with surgery, I would like to workup his postprandial nausea.  We will start with an ultrasound to rule out gallbladder disease.  We will discuss the results of the ultrasound with him before proceeding with umbilical hernia repair.   Kevin Keller Jannelle Memory, MD  01/03/2024 11:38 AM

## 2024-01-06 ENCOUNTER — Other Ambulatory Visit (HOSPITAL_COMMUNITY): Payer: Self-pay

## 2024-01-07 ENCOUNTER — Other Ambulatory Visit (HOSPITAL_COMMUNITY): Payer: Self-pay

## 2024-01-07 ENCOUNTER — Other Ambulatory Visit: Payer: Self-pay

## 2024-01-08 ENCOUNTER — Other Ambulatory Visit (HOSPITAL_COMMUNITY): Payer: Self-pay

## 2024-01-08 ENCOUNTER — Other Ambulatory Visit: Payer: Self-pay

## 2024-01-08 NOTE — Progress Notes (Signed)
 Specialty Pharmacy Refill Coordination Note  Kevin Keller is a 43 y.o. male contacted today regarding refills of specialty medication(s) Emtricitab-Rilpivir-Tenofov AF (Odefsey )   Patient requested [name] at [facility] at [place] date: 01/08/24   Medication will be filled on 01/08/24.   Patient aware will not be able to AR copayment must pay $250 copayment up front going forward. Cassie offered alternative medication with copay assistance but patient refused at this time.

## 2024-01-09 ENCOUNTER — Other Ambulatory Visit: Payer: Self-pay

## 2024-01-09 ENCOUNTER — Other Ambulatory Visit (HOSPITAL_COMMUNITY): Payer: Self-pay

## 2024-01-11 ENCOUNTER — Ambulatory Visit (HOSPITAL_COMMUNITY)
Admission: RE | Admit: 2024-01-11 | Discharge: 2024-01-11 | Disposition: A | Discharge status: Home / Self Care | Source: Ambulatory Visit | Attending: Surgery | Admitting: Surgery

## 2024-01-11 DIAGNOSIS — R110 Nausea: Secondary | ICD-10-CM | POA: Diagnosis not present

## 2024-01-11 DIAGNOSIS — K760 Fatty (change of) liver, not elsewhere classified: Secondary | ICD-10-CM | POA: Diagnosis not present

## 2024-01-20 ENCOUNTER — Ambulatory Visit: Payer: Self-pay | Admitting: Surgery

## 2024-01-20 NOTE — H&P (Signed)
 Subjective    Chief Complaint: New Consultation (Umbilical hernia)       History of Present Illness: Kevin Keller is a 43 y.o. male who is seen today as an office consultation at the request of Dr. Levern Reader for evaluation of New Consultation (Umbilical hernia) .     This is a 43 year old male who presents with a 7-year history of a slowly enlarging umbilical hernia.  It has caused some discomfort.  The patient also reports fairly frequent postprandial nausea.  He does not really have pain associated with these nausea symptoms.  He has not had any imaging.     Review of Systems: A complete review of systems was obtained from the patient.  I have reviewed this information and discussed as appropriate with the patient.  See HPI as well for other ROS.   Review of Systems  Constitutional: Negative.   HENT: Negative.    Eyes: Negative.   Respiratory: Negative.    Cardiovascular: Negative.   Gastrointestinal:  Positive for abdominal pain and nausea.  Genitourinary: Negative.   Musculoskeletal: Negative.   Skin: Negative.   Neurological: Negative.   Endo/Heme/Allergies: Negative.   Psychiatric/Behavioral: Negative.          Medical History:     Problem List     Patient Active Problem List  Diagnosis   Postprandial nausea   Umbilical hernia without obstruction or gangrene   Obesity   Dyslipidemia   Attention deficit hyperactivity disorder (ADHD), predominantly hyperactive type   HIV (human immunodeficiency virus infection) (CMS/HHS-HCC)        Past Surgical History       Past Surgical History:  Procedure Laterality Date   REPAIR INITIAL INGUINAL HERNIA PRETERM INFANT W/WO HYDROCELECTOMY            Allergies  No Known Allergies     Medications Ordered Prior to Encounter        Current Outpatient Medications on File Prior to Visit  Medication Sig Dispense Refill   ascorbic acid (VITA-C ORAL) Take by mouth       cyanocobalamin/cobamamide (B12 SL) Place under the  tongue       docosahexaenoic acid/epa (FISH OIL ORAL) Take by mouth       multivit-min/ferrous fumarate (MULTI VITAMIN ORAL) Take by mouth       ODEFSEY  200-25-25 mg tablet Take 1 tablet by mouth once daily        No current facility-administered medications on file prior to visit.        Family History       Family History  Problem Relation Age of Onset   Breast cancer Mother     Coronary Artery Disease (Blocked arteries around heart) Father          Tobacco Use History  Social History        Tobacco Use  Smoking Status Former   Types: Cigarettes  Smokeless Tobacco Never        Social History  Social History         Socioeconomic History   Marital status: Single  Tobacco Use   Smoking status: Former      Types: Cigarettes   Smokeless tobacco: Never  Vaping Use   Vaping status: Never Used  Substance and Sexual Activity   Alcohol use: Yes   Drug use: Never    Social Drivers of Health        Housing Stability: Unknown (01/03/2024)    Housing Stability Vital Sign  Homeless in the Last Year: No        Objective:          Vitals:    01/03/24 1048 01/03/24 1049  BP: (!) 157/90    Pulse: 88    Temp: 37 C (98.6 F)    SpO2: 97%    Weight: 100 kg (220 lb 6.4 oz)    Height: 170.2 cm (5\' 7" )    PainSc:   0-No pain  PainLoc:   Umbilicus    Body mass index is 34.52 kg/m.   Physical Exam    Constitutional:  WDWN in NAD, conversant, no obvious deformities; lying in bed comfortably Eyes:  Pupils equal, round; sclera anicteric; moist conjunctiva; no lid lag HENT:  Oral mucosa moist; good dentition  Neck:  No masses palpated, trachea midline; no thyromegaly Lungs:  CTA bilaterally; normal respiratory effort Breasts:  symmetric, no nipple changes; no palpable masses or lymphadenopathy on either side CV:  Regular rate and rhythm; no murmurs; extremities well-perfused with no edema Abd:  +bowel sounds, soft, non-tender, no palpable organomegaly; upper  midline rectus diastases.  Patient has a protruding umbilical hernia with a 2 cm palpable fascial defect.  The hernia is easily reducible. Musc:  Unable to assess gait; no apparent clubbing or cyanosis in extremities Lymphatic:  No palpable cervical or axillary lymphadenopathy Skin:  Warm, dry; no sign of jaundice Psychiatric - alert and oriented x 4; calm mood and affect       Assessment and Plan:  Diagnoses and all orders for this visit:   Umbilical hernia without obstruction or gangrene   Umbilical hernia repair with mesh.  The surgical procedure has been discussed with the patient.  Potential risks, benefits, alternative treatments, and expected outcomes have been explained.  All of the patient's questions at this time have been answered.  The likelihood of reaching the patient's treatment goal is good.  The patient understand the proposed surgical procedure and wishes to proceed. Kari Otto. Eli Grizzle, MD, [facility] Surgery  General Surgery   01/20/2024 11:47 AM

## 2024-01-20 NOTE — Progress Notes (Signed)
 The 10-year ASCVD risk score (Arnett DK, et al., 2019) is: 4.3%   Values used to calculate the score:     Age: 43 years     Sex: Male     Is Non-Hispanic African American: No     Diabetic: No     Tobacco smoker: No     Systolic Blood Pressure: 157 mmHg     Is BP treated: No     HDL Cholesterol: 40 mg/dL     Total Cholesterol: 242 mg/dL  Arlon Bergamo, BSN, RN

## 2024-01-28 ENCOUNTER — Other Ambulatory Visit (HOSPITAL_COMMUNITY): Payer: Self-pay

## 2024-01-31 ENCOUNTER — Other Ambulatory Visit: Payer: Self-pay

## 2024-01-31 NOTE — Progress Notes (Signed)
 Patient Satisfaction Survey completed.

## 2024-01-31 NOTE — Progress Notes (Signed)
 Specialty Pharmacy Refill Coordination Note  Kevin Keller is a 43 y.o. male contacted today regarding refills of specialty medication(s) Emtricitab-Rilpivir-Tenofov AF (Odefsey )   Patient requested [name] at [facility] [place] date: 02/03/24   Medication will be filled on 02/03/24.

## 2024-02-03 ENCOUNTER — Other Ambulatory Visit: Payer: Self-pay

## 2024-02-10 ENCOUNTER — Other Ambulatory Visit (HOSPITAL_COMMUNITY): Payer: Self-pay

## 2024-02-11 ENCOUNTER — Other Ambulatory Visit: Payer: Self-pay

## 2024-02-11 ENCOUNTER — Telehealth: Payer: Self-pay

## 2024-02-11 ENCOUNTER — Other Ambulatory Visit (HOSPITAL_COMMUNITY): Payer: Self-pay

## 2024-02-11 NOTE — Progress Notes (Signed)
 Abe Abed secured grant for patient which brings copay to $0. Patient has been informed by Abe Abed directly.

## 2024-02-11 NOTE — Progress Notes (Signed)
 [facility] called us  while patient was there for pickup of Odefsey  stating that he could not pay the $250 copayment. Asked staff if patient wanted to speak to us  directly.   Patient called in and I spoke with him. Advised that we had had this conversation last month on 5/7 and that we were not authorized to continue to place $250 copayment on AR account but it must be paid in full each month to continue receiving medication. Reviewed account history and 5/9 transaction was placed on AR account despite [facility] notes stating that we could not continue doing this. Patient did make a payment at that time but it was not the full copayment. Advised patient that this was an oversight as he and I had clearly discussed this could not continue. During that conversation in May an alternative was also offered to him by [facility] which he declined.   Advised patient that his 2 options at this time are to pay the full copayment or to further pursue alternative. He did make a $25 payment toward account today so I offered that if he could pay the remainder of the $250 I would make that arrangement with the pharmacy. He declined to return to complete full payment and plans to discuss with provider instead.   Per Abe Abed patient was supposed to send her W2 to look further into possible assistance. He has not yet done so and she spoke to him just after my conversation and requested it again.

## 2024-02-11 NOTE — Telephone Encounter (Signed)
 [facility] Patient Advocate Encounter   I was successful in securing patient a $5000 grant from [employer] ([employer]) to provide copayment coverage for Odefsey .  This will make the out of pocket cost $0.00.     I have spoken with the patient.    The billing information is as follows and has been shared with [facility].   RxBin: [id number] PCN:   [id number] Member ID: [id number] Group ID: [id number] Dates of Eligibility: 08/15/23 through 02/10/25  Patient knows to call the office with questions or concerns.  Roylene Corn, CPhT Specialty Pharmacy Patient [facility] Phone: [phone] Fax:  [phone]

## 2024-02-11 NOTE — Telephone Encounter (Signed)
 Patient called with copay concerns regarding Odefsey . He is not interested in switching to a different medication. Appears he has not yet met his deductible and mentions that he is now paying toward an AR account. Call transferred to [facility] pharmacy team for benefits assistance.   Arlenne Kimbley, BSN, RN

## 2024-02-12 ENCOUNTER — Other Ambulatory Visit: Payer: Self-pay

## 2024-02-17 ENCOUNTER — Other Ambulatory Visit: Payer: Self-pay

## 2024-02-17 NOTE — Progress Notes (Signed)
 Patient has been notified that deductible balance has been billed to grant. Will contact him back with final result once method of AR payment refund is determined.

## 2024-02-17 NOTE — Progress Notes (Signed)
 Per Bernita Bristle was able to back-bill claim from 4/9 to grant. Called patient and LVM to inform and advise that we will coordinate how to refund his payments to the AR account and I will get back to him once I know more. Transfer call to Jen if he calls back.

## 2024-02-20 ENCOUNTER — Encounter (HOSPITAL_BASED_OUTPATIENT_CLINIC_OR_DEPARTMENT_OTHER): Payer: Self-pay | Admitting: Surgery

## 2024-02-20 ENCOUNTER — Other Ambulatory Visit: Payer: Self-pay

## 2024-02-24 ENCOUNTER — Other Ambulatory Visit (HOSPITAL_COMMUNITY): Payer: Self-pay

## 2024-02-27 ENCOUNTER — Ambulatory Visit (HOSPITAL_BASED_OUTPATIENT_CLINIC_OR_DEPARTMENT_OTHER): Admitting: Anesthesiology

## 2024-02-27 ENCOUNTER — Other Ambulatory Visit: Payer: Self-pay

## 2024-02-27 ENCOUNTER — Encounter (HOSPITAL_BASED_OUTPATIENT_CLINIC_OR_DEPARTMENT_OTHER): Payer: Self-pay | Admitting: Surgery

## 2024-02-27 ENCOUNTER — Encounter (HOSPITAL_BASED_OUTPATIENT_CLINIC_OR_DEPARTMENT_OTHER)
Admission: RE | Disposition: A | Discharge status: Home / Self Care | Payer: Self-pay | Source: Home / Self Care | Attending: Surgery

## 2024-02-27 ENCOUNTER — Ambulatory Visit (HOSPITAL_BASED_OUTPATIENT_CLINIC_OR_DEPARTMENT_OTHER)
Admission: RE | Admit: 2024-02-27 | Discharge: 2024-02-27 | Disposition: A | Discharge status: Home / Self Care | Attending: Surgery | Admitting: Surgery

## 2024-02-27 ENCOUNTER — Other Ambulatory Visit (HOSPITAL_COMMUNITY): Payer: Self-pay

## 2024-02-27 DIAGNOSIS — Z21 Asymptomatic human immunodeficiency virus [HIV] infection status: Secondary | ICD-10-CM | POA: Insufficient documentation

## 2024-02-27 DIAGNOSIS — Z87891 Personal history of nicotine dependence: Secondary | ICD-10-CM | POA: Insufficient documentation

## 2024-02-27 DIAGNOSIS — E785 Hyperlipidemia, unspecified: Secondary | ICD-10-CM | POA: Insufficient documentation

## 2024-02-27 DIAGNOSIS — Z79899 Other long term (current) drug therapy: Secondary | ICD-10-CM | POA: Insufficient documentation

## 2024-02-27 DIAGNOSIS — K429 Umbilical hernia without obstruction or gangrene: Secondary | ICD-10-CM | POA: Insufficient documentation

## 2024-02-27 DIAGNOSIS — F909 Attention-deficit hyperactivity disorder, unspecified type: Secondary | ICD-10-CM | POA: Insufficient documentation

## 2024-02-27 HISTORY — PX: UMBILICAL HERNIA REPAIR: SHX196

## 2024-02-27 SURGERY — REPAIR, HERNIA, UMBILICAL, ADULT
Anesthesia: General | Site: Abdomen

## 2024-02-27 MED ORDER — DEXAMETHASONE SODIUM PHOSPHATE 10 MG/ML IJ SOLN
INTRAMUSCULAR | Status: AC
  Filled 2024-02-27: qty 1

## 2024-02-27 MED ORDER — CHLORHEXIDINE GLUCONATE CLOTH 2 % EX PADS: 6.0000 | MEDICATED_PAD | Freq: Once | CUTANEOUS | Status: DC

## 2024-02-27 MED ORDER — OXYCODONE HCL 5 MG PO TABS
5.0000 mg | ORAL_TABLET | Freq: Four times a day (QID) | ORAL | 0 refills | Status: DC | PRN
Start: 2024-02-27 — End: 2024-05-28
  Filled 2024-02-27: qty 15, 4d supply, fill #0

## 2024-02-27 MED ORDER — ONDANSETRON HCL 4 MG/2ML IJ SOLN
INTRAMUSCULAR | Status: DC | PRN
Start: 2024-02-27 — End: 2024-02-27
  Administered 2024-02-27: 4 mg via INTRAVENOUS

## 2024-02-27 MED ORDER — PROPOFOL 10 MG/ML IV BOLUS
INTRAVENOUS | Status: AC
  Filled 2024-02-27: qty 20

## 2024-02-27 MED ORDER — LACTATED RINGERS IV SOLN: INTRAVENOUS | Status: DC

## 2024-02-27 MED ORDER — OXYCODONE HCL 5 MG PO TABS
5.0000 mg | ORAL_TABLET | Freq: Once | ORAL | Status: AC | PRN
  Administered 2024-02-27: 5 mg via ORAL

## 2024-02-27 MED ORDER — EPHEDRINE SULFATE (PRESSORS) 50 MG/ML IJ SOLN
INTRAMUSCULAR | Status: DC | PRN
  Administered 2024-02-27: 10 mg via INTRAVENOUS

## 2024-02-27 MED ORDER — BUPIVACAINE-EPINEPHRINE 0.25% -1:200000 IJ SOLN
INTRAMUSCULAR | Status: DC | PRN
  Administered 2024-02-27: 10 mL

## 2024-02-27 MED ORDER — MIDAZOLAM HCL 2 MG/2ML IJ SOLN
INTRAMUSCULAR | Status: AC
Start: 2024-02-27 — End: 2024-02-27
  Filled 2024-02-27: qty 2

## 2024-02-27 MED ORDER — MIDAZOLAM HCL 2 MG/2ML IJ SOLN
INTRAMUSCULAR | Status: DC | PRN
Start: 2024-02-27 — End: 2024-02-27
  Administered 2024-02-27: 2 mg via INTRAVENOUS

## 2024-02-27 MED ORDER — LIDOCAINE 2% (20 MG/ML) 5 ML SYRINGE
INTRAMUSCULAR | Status: AC
  Filled 2024-02-27: qty 5

## 2024-02-27 MED ORDER — LACTATED RINGERS IV SOLN: INTRAVENOUS | Status: DC | PRN

## 2024-02-27 MED ORDER — BUPIVACAINE-EPINEPHRINE (PF) 0.25% -1:200000 IJ SOLN
INTRAMUSCULAR | Status: AC
Start: 2024-02-27 — End: 2024-02-27
  Filled 2024-02-27: qty 30

## 2024-02-27 MED ORDER — ACETAMINOPHEN 500 MG PO TABS
1000.0000 mg | ORAL_TABLET | Freq: Once | ORAL | Status: AC
  Administered 2024-02-27: 1000 mg via ORAL

## 2024-02-27 MED ORDER — OXYCODONE HCL 5 MG PO TABS
ORAL_TABLET | ORAL | Status: AC
Start: 2024-02-27 — End: 2024-02-27
  Filled 2024-02-27: qty 1

## 2024-02-27 MED ORDER — LIDOCAINE HCL (CARDIAC) PF 100 MG/5ML IV SOSY
PREFILLED_SYRINGE | INTRAVENOUS | Status: DC | PRN
  Administered 2024-02-27: 100 mg via INTRAVENOUS

## 2024-02-27 MED ORDER — FENTANYL CITRATE (PF) 100 MCG/2ML IJ SOLN
INTRAMUSCULAR | Status: DC | PRN
  Administered 2024-02-27: 100 ug via INTRAVENOUS

## 2024-02-27 MED ORDER — DEXAMETHASONE SODIUM PHOSPHATE 10 MG/ML IJ SOLN
INTRAMUSCULAR | Status: DC | PRN
  Administered 2024-02-27: 5 mg via INTRAVENOUS

## 2024-02-27 MED ORDER — FENTANYL CITRATE (PF) 100 MCG/2ML IJ SOLN
INTRAMUSCULAR | Status: AC
  Filled 2024-02-27: qty 2

## 2024-02-27 MED ORDER — PROPOFOL 10 MG/ML IV BOLUS
INTRAVENOUS | Status: DC | PRN
  Administered 2024-02-27: 100 mg via INTRAVENOUS
  Administered 2024-02-27: 200 mg via INTRAVENOUS

## 2024-02-27 MED ORDER — DEXMEDETOMIDINE HCL IN NACL 200 MCG/50ML IV SOLN
INTRAVENOUS | Status: DC | PRN
  Administered 2024-02-27: 8 ug via INTRAVENOUS
  Administered 2024-02-27: 12 ug via INTRAVENOUS

## 2024-02-27 MED ORDER — ACETAMINOPHEN 500 MG PO TABS: 1000.0000 mg | ORAL_TABLET | ORAL | Status: AC

## 2024-02-27 MED ORDER — AMISULPRIDE (ANTIEMETIC) 5 MG/2ML IV SOLN: 10.0000 mg | Freq: Once | INTRAVENOUS | Status: DC | PRN

## 2024-02-27 MED ORDER — CEFAZOLIN SODIUM-DEXTROSE 2-3 GM-%(50ML) IV SOLR
INTRAVENOUS | Status: DC | PRN
  Administered 2024-02-27: 2 g via INTRAVENOUS

## 2024-02-27 MED ORDER — ACETAMINOPHEN 500 MG PO TABS
ORAL_TABLET | ORAL | Status: AC
  Filled 2024-02-27: qty 2

## 2024-02-27 MED ORDER — FENTANYL CITRATE (PF) 100 MCG/2ML IJ SOLN: 25.0000 ug | INTRAMUSCULAR | Status: DC | PRN

## 2024-02-27 MED ORDER — CEFAZOLIN SODIUM-DEXTROSE 2-4 GM/100ML-% IV SOLN: 2.0000 g | INTRAVENOUS | Status: DC

## 2024-02-27 MED ORDER — KETOROLAC TROMETHAMINE 30 MG/ML IJ SOLN
INTRAMUSCULAR | Status: DC | PRN
  Administered 2024-02-27: 30 mg via INTRAVENOUS

## 2024-02-27 MED ORDER — OXYCODONE HCL 5 MG/5ML PO SOLN: 5.0000 mg | Freq: Once | ORAL | Status: AC | PRN

## 2024-02-27 MED ORDER — ONDANSETRON HCL 4 MG/2ML IJ SOLN
INTRAMUSCULAR | Status: AC
  Filled 2024-02-27: qty 2

## 2024-02-27 SURGICAL SUPPLY — 45 items
APPLICATOR COTTON TIP 6 STRL (MISCELLANEOUS) IMPLANT
BENZOIN TINCTURE PRP APPL 2/3 (GAUZE/BANDAGES/DRESSINGS) ×1 IMPLANT
BLADE HEX COATED 2.75 (ELECTRODE) ×1 IMPLANT
BLADE SURG 15 STRL LF DISP TIS (BLADE) ×1 IMPLANT
CANISTER SUCT 1200ML W/VALVE (MISCELLANEOUS) IMPLANT
CHLORAPREP W/TINT 26 (MISCELLANEOUS) ×1 IMPLANT
COVER BACK TABLE 60X90IN (DRAPES) ×1 IMPLANT
COVER MAYO STAND STRL (DRAPES) ×1 IMPLANT
DRAPE LAPAROTOMY 100X72 PEDS (DRAPES) ×1 IMPLANT
DRAPE UTILITY XL STRL (DRAPES) ×1 IMPLANT
DRSG TEGADERM 4X4.75 (GAUZE/BANDAGES/DRESSINGS) ×1 IMPLANT
ELECTRODE REM PT RTRN 9FT ADLT (ELECTROSURGICAL) ×1 IMPLANT
GAUZE SPONGE 2X2 STRL 8-PLY (GAUZE/BANDAGES/DRESSINGS) ×1 IMPLANT
GAUZE SPONGE 4X4 12PLY STRL LF (GAUZE/BANDAGES/DRESSINGS) IMPLANT
GLOVE BIO SURGEON STRL SZ 6.5 (GLOVE) IMPLANT
GLOVE BIO SURGEON STRL SZ7 (GLOVE) ×1 IMPLANT
GLOVE BIOGEL PI IND STRL 6.5 (GLOVE) IMPLANT
GLOVE BIOGEL PI IND STRL 7.5 (GLOVE) ×1 IMPLANT
GOWN STRL REUS W/ TWL LRG LVL3 (GOWN DISPOSABLE) ×2 IMPLANT
MESH VENTRALEX ST 1-7/10 CRC S (Mesh General) IMPLANT
NDL HYPO 25X1 1.5 SAFETY (NEEDLE) ×1 IMPLANT
NDL SAFETY ECLIPSE 18X1.5 (NEEDLE) IMPLANT
NEEDLE HYPO 25X1 1.5 SAFETY (NEEDLE) ×1 IMPLANT
NS IRRIG 1000ML POUR BTL (IV SOLUTION) IMPLANT
PACK BASIN DAY SURGERY FS (CUSTOM PROCEDURE TRAY) ×1 IMPLANT
PENCIL SMOKE EVACUATOR (MISCELLANEOUS) ×1 IMPLANT
SLEEVE SCD COMPRESS KNEE MED (STOCKING) ×1 IMPLANT
SPIKE FLUID TRANSFER (MISCELLANEOUS) IMPLANT
STAPLER SKIN PROX WIDE 3.9 (STAPLE) IMPLANT
STRIP CLOSURE SKIN 1/2X4 (GAUZE/BANDAGES/DRESSINGS) ×1 IMPLANT
SUT MNCRL AB 4-0 PS2 18 (SUTURE) ×1 IMPLANT
SUT NOVA 0 T19/GS 22DT (SUTURE) IMPLANT
SUT NOVA NAB GS-21 1 T12 (SUTURE) IMPLANT
SUT PDS AB 0 CT 36 (SUTURE) IMPLANT
SUT PROLENE 0 CT 2 (SUTURE) IMPLANT
SUT SILK 3-0 18XBRD TIE BLK (SUTURE) IMPLANT
SUT VIC AB 2-0 CT1 TAPERPNT 27 (SUTURE) IMPLANT
SUT VIC AB 3-0 SH 27X BRD (SUTURE) ×1 IMPLANT
SUT VIC AB 4-0 BRD 54 (SUTURE) IMPLANT
SUT VIC AB 4-0 PS2 18 (SUTURE) IMPLANT
SYR BULB EAR ULCER 3OZ GRN STR (SYRINGE) IMPLANT
SYR CONTROL 10ML LL (SYRINGE) ×1 IMPLANT
TOWEL GREEN STERILE FF (TOWEL DISPOSABLE) ×1 IMPLANT
TUBE CONNECTING 20X1/4 (TUBING) ×1 IMPLANT
YANKAUER SUCT BULB TIP NO VENT (SUCTIONS) ×1 IMPLANT

## 2024-02-27 NOTE — Anesthesia Procedure Notes (Signed)
 Procedure Name: LMA Insertion Date/Time: 02/27/2024 12:10 PM  Performed by: Burnard Rosaline HERO, CRNAPre-anesthesia Checklist: Patient identified, Emergency Drugs available, Suction available and Patient being monitored Patient Re-evaluated:Patient Re-evaluated prior to induction Oxygen Delivery Method: Circle system utilized Preoxygenation: Pre-oxygenation with 100% oxygen Induction Type: IV induction Ventilation: Mask ventilation without difficulty LMA: LMA inserted LMA Size: 4.0 Number of attempts: 1 Airway Equipment and Method: Bite block Placement Confirmation: positive ETCO2, breath sounds checked- equal and bilateral and CO2 detector Tube secured with: Tape Dental Injury: Teeth and Oropharynx as per pre-operative assessment

## 2024-02-27 NOTE — Op Note (Signed)
 Indications:  This is a 43 year old male who presents with a 7-year history of a slowly enlarging umbilical hernia. It has caused some discomfort.   Pre-operative diagnosis:  Umbilical hernia  Post-operative diagnosis:  Same  Procedure:  Umbilical hernia repair with mesh  Surgeon:  Donnice MARLA Lima Resident:  Dr. Mae Flock I was personally present during the key and critical portions of this procedure and immediately available throughout the entire procedure, as documented in my operative note.   Procedure Details  The patient was seen again in the Holding Room. The risks, benefits, complications, treatment options, and expected outcomes were discussed with the patient. The possibilities of reaction to medication, pulmonary aspiration, perforation of viscus, bleeding, recurrent infection, the need for additional procedures, and development of a complication requiring transfusion or further operation were discussed with the patient and/or family. There was concurrence with the proposed plan, and informed consent was obtained. The site of surgery was properly noted/marked. The patient was taken to the Operating Room, identified as Kevin Keller, and the procedure verified as umbilical hernia repair. A Time Out was held and the above information confirmed.  After an adequate level of general anesthesia was obtained, the patient's abdomen was prepped with Chloraprep and draped in sterile fashion.  We made a transverse incision below the umbilicus.  Dissection was carried down to the hernia sac with cautery.  We dissected bluntly around the hernia sac down to the edge of the fascial defect.  There was no sign of bowel or incarcerated omentum in the hernia sac.  The hernia sac was amputated at the edge of the fascial defect.  The fascial defect measured 2 cm.  We cleared the fascia in all directions.  A small Ventralex mesh was inserted into the pre-peritoneal space and was deployed.  The mesh was  secured with four trans-fascial sutures of 0 Novofil.  The fascial defect was closed with a running 1 Novofil suture.  The base of the umbilicus was tacked down with 3-0 Vicryl.  3-0 Vicryl was used to close the subcutaneous tissues and 4-0 Monocryl was used to close the skin.  Steri-strips and clean dressing were applied.  The patient was extubated and brought to the recovery room in stable condition.  All sponge, instrument, and needle counts were correct prior to closure and at the conclusion of the case.   Estimated Blood Loss: Minimal          Complications: None; patient tolerated the procedure well.         Disposition: PACU - hemodynamically stable.         Condition: stable  Donnice MARLA. Lima, MD, [facility] Surgery  General Surgery   02/27/2024 12:33 PM

## 2024-02-27 NOTE — Anesthesia Postprocedure Evaluation (Signed)
 Anesthesia Post Note  Patient: Kevin Keller  Procedure(s) Performed: REPAIR, HERNIA, UMBILICAL, ADULT (Abdomen)     Patient location during evaluation: PACU Anesthesia Type: General Level of consciousness: awake Pain management: pain level controlled Vital Signs Assessment: post-procedure vital signs reviewed and stable Respiratory status: spontaneous breathing, nonlabored ventilation and respiratory function stable Cardiovascular status: blood pressure returned to baseline and stable Postop Assessment: no apparent nausea or vomiting Anesthetic complications: no   No notable events documented.  Last Vitals:  Vitals:   02/27/24 1300 02/27/24 1315  BP: 114/67 126/75  Pulse: 85 82  Resp: 18 (!) 25  Temp:    SpO2: 92% 92%    Last Pain:  Vitals:   02/27/24 1315  TempSrc:   PainSc: 0-No pain                 Delon Aisha Arch

## 2024-02-27 NOTE — H&P (Signed)
 Chief Complaint: New Consultation (Umbilical hernia)       History of Present Illness: Kevin Keller is a 43 y.o. male who is seen today as an office consultation at the request of Dr. Luiz for evaluation of New Consultation (Umbilical hernia) .     This is a 43 year old male who presents with a 7-year history of a slowly enlarging umbilical hernia.  It has caused some discomfort.  The patient also reports fairly frequent postprandial nausea.  He does not really have pain associated with these nausea symptoms.  He has not had any imaging.     Review of Systems: A complete review of systems was obtained from the patient.  I have reviewed this information and discussed as appropriate with the patient.  See HPI as well for other ROS.   Review of Systems  Constitutional: Negative.   HENT: Negative.    Eyes: Negative.   Respiratory: Negative.    Cardiovascular: Negative.   Gastrointestinal:  Positive for abdominal pain and nausea.  Genitourinary: Negative.   Musculoskeletal: Negative.   Skin: Negative.   Neurological: Negative.   Endo/Heme/Allergies: Negative.   Psychiatric/Behavioral: Negative.          Medical History:     Problem List       Patient Active Problem List  Diagnosis   Postprandial nausea   Umbilical hernia without obstruction or gangrene   Obesity   Dyslipidemia   Attention deficit hyperactivity disorder (ADHD), predominantly hyperactive type   HIV (human immunodeficiency virus infection) (CMS/HHS-HCC)        Past Surgical History           Past Surgical History:  Procedure Laterality Date   REPAIR INITIAL INGUINAL HERNIA PRETERM INFANT W/WO HYDROCELECTOMY            Allergies  No Known Allergies      Medications Ordered Prior to Encounter             Current Outpatient Medications on File Prior to Visit  Medication Sig Dispense Refill   ascorbic acid (VITA-C ORAL) Take by mouth       cyanocobalamin/cobamamide (B12 SL) Place under the  tongue       docosahexaenoic acid/epa (FISH OIL ORAL) Take by mouth       multivit-min/ferrous fumarate (MULTI VITAMIN ORAL) Take by mouth       ODEFSEY  200-25-25 mg tablet Take 1 tablet by mouth once daily        No current facility-administered medications on file prior to visit.        Family History           Family History  Problem Relation Age of Onset   Breast cancer Mother     Coronary Artery Disease (Blocked arteries around heart) Father          Tobacco Use History  Social History           Tobacco Use  Smoking Status Former   Types: Cigarettes  Smokeless Tobacco Never        Social History  Social History             Socioeconomic History   Marital status: Single  Tobacco Use   Smoking status: Former      Types: Cigarettes   Smokeless tobacco: Never  Vaping Use   Vaping status: Never Used  Substance and Sexual Activity   Alcohol use: Yes   Drug use: Never    Social Drivers of Health  Housing Stability: Unknown (01/03/2024)    Housing Stability Vital Sign     Homeless in the Last Year: No        Objective:             Vitals:    01/03/24 1048 01/03/24 1049  BP: (!) 157/90    Pulse: 88    Temp: 37 C (98.6 F)    SpO2: 97%    Weight: 100 kg (220 lb 6.4 oz)    Height: 170.2 cm (5' 7)    PainSc:   0-No pain  PainLoc:   Umbilicus    Body mass index is 34.52 kg/m.   Physical Exam    Constitutional:  WDWN in NAD, conversant, no obvious deformities; lying in bed comfortably Eyes:  Pupils equal, round; sclera anicteric; moist conjunctiva; no lid lag HENT:  Oral mucosa moist; good dentition  Neck:  No masses palpated, trachea midline; no thyromegaly Lungs:  CTA bilaterally; normal respiratory effort Breasts:  symmetric, no nipple changes; no palpable masses or lymphadenopathy on either side CV:  Regular rate and rhythm; no murmurs; extremities well-perfused with no edema Abd:  +bowel sounds, soft, non-tender, no palpable  organomegaly; upper midline rectus diastases.  Patient has a protruding umbilical hernia with a 2 cm palpable fascial defect.  The hernia is easily reducible. Musc:  Unable to assess gait; no apparent clubbing or cyanosis in extremities Lymphatic:  No palpable cervical or axillary lymphadenopathy Skin:  Warm, dry; no sign of jaundice Psychiatric - alert and oriented x 4; calm mood and affect       Assessment and Plan:  Diagnoses and all orders for this visit:   Umbilical hernia without obstruction or gangrene   Umbilical hernia repair with mesh.  The surgical procedure has been discussed with the patient.  Potential risks, benefits, alternative treatments, and expected outcomes have been explained.  All of the patient's questions at this time have been answered.  The likelihood of reaching the patient's treatment goal is good.  The patient understand the proposed surgical procedure and wishes to proceed.  Donnice POUR. Belinda, MD, [facility] Surgery  General Surgery   02/27/2024 9:42 AM

## 2024-02-27 NOTE — Anesthesia Preprocedure Evaluation (Addendum)
 Anesthesia Evaluation  Patient identified by MRN, date of birth, ID band Patient awake    Reviewed: Allergy & Precautions, NPO status , Patient's Chart, lab work & pertinent test results  History of Anesthesia Complications Negative for: history of anesthetic complications  Airway Mallampati: II  TM Distance: >3 FB Neck ROM: Full    Dental  (+) Dental Advisory Given,    Pulmonary neg shortness of breath, neg sleep apnea, neg COPD, neg recent URI, former smoker   Pulmonary exam normal breath sounds clear to auscultation       Cardiovascular (-) hypertension(-) angina (-) Past MI, (-) Cardiac Stents and (-) CABG (-) dysrhythmias  Rhythm:Regular Rate:Normal  HLD   Neuro/Psych  PSYCHIATRIC DISORDERS (ADHD)      negative neurological ROS     GI/Hepatic Neg liver ROS,neg GERD  ,,Umbilical hernia   Endo/Other  negative endocrine ROS    Renal/GU negative Renal ROS     Musculoskeletal   Abdominal  (+) + obese  Peds  Hematology  (+) HIV (on Odefsey )  Anesthesia Other Findings   Reproductive/Obstetrics                             Anesthesia Physical Anesthesia Plan  ASA: 2  Anesthesia Plan: General   Post-op Pain Management: Tylenol PO (pre-op)*   Induction: Intravenous  PONV Risk Score and Plan: 2 and Ondansetron , Dexamethasone and Treatment may vary due to age or medical condition  Airway Management Planned: LMA  Additional Equipment:   Intra-op Plan:   Post-operative Plan: Extubation in OR  Informed Consent: I have reviewed the patients History and Physical, chart, labs and discussed the procedure including the risks, benefits and alternatives for the proposed anesthesia with the patient or authorized representative who has indicated his/her understanding and acceptance.     Dental advisory given  Plan Discussed with: CRNA and Anesthesiologist  Anesthesia Plan Comments: (Risks  of general anesthesia discussed including, but not limited to, sore throat, hoarse voice, chipped/damaged teeth, injury to vocal cords, nausea and vomiting, allergic reactions, lung infection, heart attack, stroke, and death. All questions answered. )        Anesthesia Quick Evaluation

## 2024-02-27 NOTE — Transfer of Care (Signed)
 Immediate Anesthesia Transfer of Care Note  Patient: Kevin Keller  Procedure(s) Performed: REPAIR, HERNIA, UMBILICAL, ADULT (Abdomen)  Patient Location: PACU  Anesthesia Type:General  Level of Consciousness: awake, alert , and oriented  Airway & Oxygen Therapy: Patient Spontanous Breathing and Patient connected to face mask oxygen  Post-op Assessment: Report given to RN and Post -op Vital signs reviewed and stable  Post vital signs: Reviewed and stable  Last Vitals:  Vitals Value Taken Time  BP 118/67 02/27/24 12:57  Temp    Pulse 85 02/27/24 12:59  Resp 21 02/27/24 12:59  SpO2 93 % 02/27/24 12:59  Vitals shown include unfiled device data.  Last Pain:  Vitals:   02/27/24 0958  TempSrc: Temporal  PainSc: 3       Patients Stated Pain Goal: 5 (02/27/24 0958)  Complications: No notable events documented.

## 2024-02-27 NOTE — Discharge Instructions (Addendum)
 [facility] _______[facility]  UMBILICAL HERNIA REPAIR: POST OP INSTRUCTIONS  Always review your discharge instruction sheet given to you by the facility where your surgery was performed. IF YOU HAVE DISABILITY OR FAMILY LEAVE FORMS, YOU MUST BRING THEM TO THE OFFICE FOR PROCESSING.   DO NOT GIVE THEM TO YOUR DOCTOR.  1. A  prescription for pain medication may be given to you upon discharge.  Take your pain medication as prescribed, if needed.  If narcotic pain medicine is not needed, then you may take acetaminophen (Tylenol) or ibuprofen (Advil) as needed. No Tylenol until after 4:00pm today. No Ibuprofen until after 6:40pm today. 2. Take your usually prescribed medications unless otherwise directed. If you need a refill on your pain medication, please contact your pharmacy.  They will contact our office to request authorization. Prescriptions will not be filled after 5 pm or on week-ends. 3. You should follow a light diet the first 24 hours after arrival home, such as soup and crackers, etc.  Be sure to include lots of fluids daily.  Resume your normal diet the day after surgery. 4.Most patients will experience some swelling and bruising around the umbilicus.  Ice packs and reclining will help.  Swelling and bruising can take several days to resolve.  6. It is common to experience some constipation if taking pain medication after surgery.  Increasing fluid intake and taking a stool softener (such as Colace) will usually help or prevent this problem from occurring.  A mild laxative (Milk of Magnesia or Miralax) should be taken according to package directions if there are no bowel movements after 48 hours. 7. Unless discharge instructions indicate otherwise, you may remove your bandages 24-48 hours after surgery, and you may shower at that time.  You may have steri-strips (small skin tapes) in place directly over the incision.  These strips should be left on the skin for 7-10 days.   8.  ACTIVITIES:  You may resume regular (light) daily activities beginning the next day--such as daily self-care, walking, climbing stairs--gradually increasing activities as tolerated.  You may have sexual intercourse when it is comfortable.  Refrain from any heavy lifting or straining until approved by your doctor.  a.You may drive when you are no longer taking prescription pain medication, you can comfortably wear a seatbelt, and you can safely maneuver your car and apply brakes. b.RETURN TO WORK:   _____________________________________________  9.You should see your doctor in the office for a follow-up appointment approximately 2-3 weeks after your surgery.  Make sure that you call for this appointment within a day or two after you arrive home to insure a convenient appointment time. 10.OTHER INSTRUCTIONS: _________________________    _____________________________________  WHEN TO CALL YOUR DOCTOR: Fever over 101.0 Inability to urinate Nausea and/or vomiting Extreme swelling or bruising Continued bleeding from incision. Increased pain, redness, or drainage from the incision  The clinic staff is available to answer your questions during regular business hours.  Please don't hesitate to call and ask to speak to one of the nurses for clinical concerns.  If you have a medical emergency, go to the nearest emergency room or call 911.  A surgeon from [facility] is always on call at the hospital   [address], [place], [place]  [address] ?  [address] [address], [place], [place]   [address] [phone] ? [phone] ? FAX [phone] Web site: [email or web address]   Post Anesthesia Home Care Instructions  Activity: Get plenty  of rest for the remainder of the day. A responsible individual must stay with you for 24 hours following the procedure.  For the next 24 hours, DO NOT: -Drive a car -Advertising copywriter -Drink alcoholic beverages -Take any  medication unless instructed by your physician -Make any legal decisions or sign important papers.  Meals: Start with liquid foods such as gelatin or soup. Progress to regular foods as tolerated. Avoid greasy, spicy, heavy foods. If nausea and/or vomiting occur, drink only clear liquids until the nausea and/or vomiting subsides. Call your physician if vomiting continues.  Special Instructions/Symptoms: Your throat may feel dry or sore from the anesthesia or the breathing tube placed in your throat during surgery. If this causes discomfort, gargle with warm salt water. The discomfort should disappear within 24 hours.  If you had a scopolamine patch placed behind your ear for the management of post- operative nausea and/or vomiting:  1. The medication in the patch is effective for 72 hours, after which it should be removed.  Wrap patch in a tissue and discard in the trash. Wash hands thoroughly with soap and water. 2. You may remove the patch earlier than 72 hours if you experience unpleasant side effects which may include dry mouth, dizziness or visual disturbances. 3. Avoid touching the patch. Wash your hands with soap and water after contact with the patch.

## 2024-02-27 NOTE — Progress Notes (Signed)
 Specialty Pharmacy Refill Coordination Note  Spoke with Ona, Rathert (Self).   Rexford Prevo is a 43 y.o. male contacted today regarding refills of specialty medication(s) Emtricitab-Rilpivir-Tenofov AF (Odefsey )  Patient requested: [name] at [facility] at [place] date: 03/02/24 (after 4pm)  Medication will be filled on 03/02/24.

## 2024-02-28 ENCOUNTER — Encounter (HOSPITAL_BASED_OUTPATIENT_CLINIC_OR_DEPARTMENT_OTHER): Payer: Self-pay | Admitting: Surgery

## 2024-02-28 LAB — SURGICAL PATHOLOGY

## 2024-03-02 ENCOUNTER — Other Ambulatory Visit: Payer: Self-pay

## 2024-03-03 ENCOUNTER — Other Ambulatory Visit (HOSPITAL_COMMUNITY): Payer: Self-pay

## 2024-03-04 NOTE — Progress Notes (Signed)
 Cash refund of $275 is ready at [facility]. Patient recovering from surgery so fiance Toribio Goodnight will pick up on his behalf.

## 2024-03-20 DIAGNOSIS — K429 Umbilical hernia without obstruction or gangrene: Secondary | ICD-10-CM | POA: Diagnosis not present

## 2024-03-31 ENCOUNTER — Other Ambulatory Visit (HOSPITAL_COMMUNITY): Payer: Self-pay

## 2024-04-02 ENCOUNTER — Other Ambulatory Visit: Payer: Self-pay | Admitting: Pharmacy Technician

## 2024-04-02 ENCOUNTER — Other Ambulatory Visit: Payer: Self-pay

## 2024-04-02 NOTE — Progress Notes (Signed)
 Specialty Pharmacy Refill Coordination Note  Kevin Keller is a 43 y.o. male contacted today regarding refills of specialty medication(s) Emtricitab-Rilpivir-Tenofov AF (Odefsey )   Patient requested [name] at [facility] at [place] date: 04/02/24   Medication will be filled on 04/02/24.

## 2024-04-26 ENCOUNTER — Encounter (INDEPENDENT_AMBULATORY_CARE_PROVIDER_SITE_OTHER): Payer: Self-pay

## 2024-04-27 ENCOUNTER — Other Ambulatory Visit: Payer: Self-pay

## 2024-04-27 NOTE — Progress Notes (Signed)
 Specialty Pharmacy Refill Coordination Note  Kevin Keller is a 43 y.o. male contacted today regarding refills of specialty medication(s) Emtricitab-Rilpivir-Tenofov AF (Odefsey )   Patient requested (Patient-Rptd) Pickup at [facility] at [facility] date: (Patient-Rptd) 04/27/24   Medication will be filled on 04/27/24.

## 2024-05-07 ENCOUNTER — Other Ambulatory Visit: Payer: Self-pay

## 2024-05-08 ENCOUNTER — Other Ambulatory Visit: Payer: Self-pay

## 2024-05-08 DIAGNOSIS — B20 Human immunodeficiency virus [HIV] disease: Secondary | ICD-10-CM

## 2024-05-08 DIAGNOSIS — Z113 Encounter for screening for infections with a predominantly sexual mode of transmission: Secondary | ICD-10-CM

## 2024-05-08 DIAGNOSIS — Z79899 Other long term (current) drug therapy: Secondary | ICD-10-CM

## 2024-05-08 NOTE — Progress Notes (Signed)
 Specialty Pharmacy Ongoing Clinical Assessment Note  Kevin Keller is a 43 y.o. male who is being followed by the specialty pharmacy service for RxSp HIV   Patient's specialty medication(s) reviewed today: Emtricitab-Rilpivir-Tenofov AF (Odefsey )   Missed doses in the last 4 weeks: 0   Patient/Caregiver did not have any additional questions or concerns.   Therapeutic benefit summary: Patient is achieving benefit   Adverse events/side effects summary: No adverse events/side effects   Patient's therapy is appropriate to: Continue    Goals Addressed             This Visit's Progress    Achieve Undetectable HIV Viral Load < 20   On track    Patient is on track. Patient will maintain adherence. Patient's viral load remains undetectable long term         Follow up: 12 months  [facility]

## 2024-05-13 ENCOUNTER — Other Ambulatory Visit: Payer: Self-pay

## 2024-05-13 ENCOUNTER — Other Ambulatory Visit

## 2024-05-13 DIAGNOSIS — B20 Human immunodeficiency virus [HIV] disease: Secondary | ICD-10-CM | POA: Diagnosis not present

## 2024-05-13 DIAGNOSIS — Z113 Encounter for screening for infections with a predominantly sexual mode of transmission: Secondary | ICD-10-CM

## 2024-05-14 LAB — T-HELPER CELL (CD4) - (RCID CLINIC ONLY)
CD4 % Helper T Cell: 31 % — ABNORMAL LOW (ref 33–65)
CD4 T Cell Abs: 575 /uL (ref 400–1790)

## 2024-05-15 LAB — CBC WITH DIFFERENTIAL/PLATELET
Absolute Lymphocytes: 2200 {cells}/uL (ref 850–3900)
Absolute Monocytes: 627 {cells}/uL (ref 200–950)
Basophils Absolute: 63 {cells}/uL (ref 0–200)
Basophils Relative: 1.1 %
Eosinophils Absolute: 68 {cells}/uL (ref 15–500)
Eosinophils Relative: 1.2 %
HCT: 49.1 % (ref 38.5–50.0)
Hemoglobin: 16.4 g/dL (ref 13.2–17.1)
MCH: 32.8 pg (ref 27.0–33.0)
MCHC: 33.4 g/dL (ref 32.0–36.0)
MCV: 98.2 fL (ref 80.0–100.0)
MPV: 10.1 fL (ref 7.5–12.5)
Monocytes Relative: 11 %
Neutro Abs: 2742 {cells}/uL (ref 1500–7800)
Neutrophils Relative %: 48.1 %
Platelets: 287 Thousand/uL (ref 140–400)
RBC: 5 Million/uL (ref 4.20–5.80)
RDW: 12.2 % (ref 11.0–15.0)
Total Lymphocyte: 38.6 %
WBC: 5.7 Thousand/uL (ref 3.8–10.8)

## 2024-05-15 LAB — COMPLETE METABOLIC PANEL WITHOUT GFR
AG Ratio: 1.5 (calc) (ref 1.0–2.5)
ALT: 28 U/L (ref 9–46)
AST: 21 U/L (ref 10–40)
Albumin: 4.2 g/dL (ref 3.6–5.1)
Alkaline phosphatase (APISO): 72 U/L (ref 36–130)
BUN: 17 mg/dL (ref 7–25)
CO2: 29 mmol/L (ref 20–32)
Calcium: 9.6 mg/dL (ref 8.6–10.3)
Chloride: 101 mmol/L (ref 98–110)
Creat: 0.81 mg/dL (ref 0.60–1.29)
Globulin: 2.8 g/dL (ref 1.9–3.7)
Glucose, Bld: 83 mg/dL (ref 65–99)
Potassium: 4.2 mmol/L (ref 3.5–5.3)
Sodium: 138 mmol/L (ref 135–146)
Total Bilirubin: 0.6 mg/dL (ref 0.2–1.2)
Total Protein: 7 g/dL (ref 6.1–8.1)

## 2024-05-15 LAB — RPR: RPR Ser Ql: NONREACTIVE

## 2024-05-15 LAB — LIPID PANEL
Cholesterol: 223 mg/dL — ABNORMAL HIGH (ref <200)
HDL: 35 mg/dL — ABNORMAL LOW (ref >=40)
Non-HDL Cholesterol (Calc): 188 mg/dL — ABNORMAL HIGH (ref <130)
Total CHOL/HDL Ratio: 6.4 (calc) — ABNORMAL HIGH (ref <5.0)
Triglycerides: 741 mg/dL — ABNORMAL HIGH (ref <150)

## 2024-05-15 LAB — HIV-1 RNA QUANT-NO REFLEX-BLD
HIV 1 RNA Quant: NOT DETECTED {copies}/mL
HIV-1 RNA Quant, Log: NOT DETECTED {Log_copies}/mL

## 2024-05-19 ENCOUNTER — Encounter: Payer: Self-pay | Admitting: Internal Medicine

## 2024-05-21 ENCOUNTER — Ambulatory Visit: Admitting: Internal Medicine

## 2024-05-25 ENCOUNTER — Other Ambulatory Visit: Payer: Self-pay

## 2024-05-25 ENCOUNTER — Encounter (INDEPENDENT_AMBULATORY_CARE_PROVIDER_SITE_OTHER): Payer: Self-pay

## 2024-05-25 ENCOUNTER — Other Ambulatory Visit (HOSPITAL_BASED_OUTPATIENT_CLINIC_OR_DEPARTMENT_OTHER): Payer: Self-pay

## 2024-05-25 MED ORDER — FLUZONE 0.5 ML IM SUSY
0.5000 mL | PREFILLED_SYRINGE | Freq: Once | INTRAMUSCULAR | 0 refills | Status: AC
  Filled 2024-05-25: qty 0.5, 1d supply, fill #0

## 2024-05-25 NOTE — Progress Notes (Signed)
 Specialty Pharmacy Refill Coordination Note  Kevin Keller is a 43 y.o. male contacted today regarding refills of specialty medication(s) Emtricitab-Rilpivir-Tenofov AF (Odefsey )   Patient requested (Patient-Rptd) Pickup at [facility] at [facility] date: (Patient-Rptd) 05/26/24   Medication will be filled on 05/25/24.

## 2024-05-27 ENCOUNTER — Ambulatory Visit: Payer: Self-pay | Admitting: Internal Medicine

## 2024-05-27 ENCOUNTER — Ambulatory Visit: Admitting: Internal Medicine

## 2024-05-28 ENCOUNTER — Encounter: Payer: Self-pay | Admitting: Internal Medicine

## 2024-05-28 ENCOUNTER — Ambulatory Visit (INDEPENDENT_AMBULATORY_CARE_PROVIDER_SITE_OTHER): Admitting: Internal Medicine

## 2024-05-28 ENCOUNTER — Other Ambulatory Visit: Payer: Self-pay

## 2024-05-28 ENCOUNTER — Other Ambulatory Visit (HOSPITAL_COMMUNITY): Payer: Self-pay

## 2024-05-28 VITALS — BP 136/84 | HR 76 | Temp 98.1°F | Ht 67.0 in | Wt 217.0 lb

## 2024-05-28 DIAGNOSIS — Z79899 Other long term (current) drug therapy: Secondary | ICD-10-CM

## 2024-05-28 DIAGNOSIS — E782 Mixed hyperlipidemia: Secondary | ICD-10-CM

## 2024-05-28 DIAGNOSIS — B20 Human immunodeficiency virus [HIV] disease: Secondary | ICD-10-CM | POA: Diagnosis not present

## 2024-05-28 DIAGNOSIS — E781 Pure hyperglyceridemia: Secondary | ICD-10-CM

## 2024-05-28 MED ORDER — FENOFIBRATE 145 MG PO TABS
145.0000 mg | ORAL_TABLET | Freq: Every day | ORAL | 11 refills | Status: AC
  Filled 2024-05-28: qty 30, 30d supply, fill #0
  Filled 2024-06-22: qty 30, 30d supply, fill #1
  Filled 2024-07-20: qty 30, 30d supply, fill #2
  Filled 2024-08-14: qty 30, 30d supply, fill #3
  Filled 2024-09-09 – 2024-09-15 (×2): qty 30, 30d supply, fill #4

## 2024-05-28 MED ORDER — ROSUVASTATIN CALCIUM 20 MG PO TABS
20.0000 mg | ORAL_TABLET | Freq: Every day | ORAL | 11 refills | Status: AC
  Filled 2024-05-28: qty 30, 30d supply, fill #0
  Filled 2024-06-22 (×2): qty 30, 30d supply, fill #1
  Filled 2024-07-20: qty 30, 30d supply, fill #2
  Filled 2024-08-14: qty 30, 30d supply, fill #3
  Filled 2024-09-15: qty 30, 30d supply, fill #4

## 2024-05-28 NOTE — Progress Notes (Unsigned)
 Patient ID: Kevin Keller, male   DOB: June 21, 1981, 43 y.o.   MRN: [id number]  HPI 43 yo M with HIV disease, CD 4 count , who had umbilical hernia and underwent repair in 02/27/24. But also found to have fatty liver on imaging. No issues with surgery.   Outpatient Encounter Medications as of 05/28/2024  Medication Sig   emtricitabine -rilpivir-tenofovir  AF (ODEFSEY ) 200-25-25 MG TABS tablet Take 1 tablet by mouth daily.   Multiple Vitamin (MULTIVITAMIN) tablet Take 1 tablet by mouth daily.   Omega-3 Fatty Acids (FISH OIL PO) Take by mouth.   [DISCONTINUED] amphetamine -dextroamphetamine  (ADDERALL  XR) 20 MG 24 hr capsule Take 1 capsule by mouth daily. (Patient not taking: Reported on 05/28/2024)   [DISCONTINUED] clotrimazole  (LOTRIMIN ) 1 % cream Apply once daily until lesion is gone. (Patient not taking: Reported on 05/28/2024)   [DISCONTINUED] cyclobenzaprine  (FLEXERIL ) 5 MG tablet Take 1 tablet (5 mg total) by mouth at bedtime as needed for muscle spasms (Patient not taking: Reported on 05/28/2024)   [DISCONTINUED] loratadine (CLARITIN) 10 MG tablet Take 10 mg by mouth daily. Reported on 01/24/2016 (Patient not taking: Reported on 05/28/2024)   [DISCONTINUED] oxyCODONE  (OXY IR/ROXICODONE ) 5 MG immediate release tablet Take 1 tablet (5 mg total) by mouth every 6 (six) hours as needed for severe pain (pain score 7-10). (Patient not taking: Reported on 05/28/2024)   No facility-administered encounter medications on file as of 05/28/2024.     Patient Active Problem List   Diagnosis Date Noted   Health care maintenance 09/20/2021   Mass of subcutaneous tissue of back 11/24/2020   Right low back pain 10/20/2020   Attention deficit hyperactivity disorder (ADHD), predominantly hyperactive type 10/07/2019   Scrotal swelling 10/07/2019   Umbilical hernia 11/10/2018   Tinea cruris 11/20/2017   Obesity 01/17/2017   Dyslipidemia 01/24/2016   History of genital warts 07/06/2014   History of chlamydia  infection 07/06/2014   H/O seasonal allergies 07/05/2014   Human immunodeficiency virus (HIV) disease (HCC) 05/07/2014     Health Maintenance Due  Topic Date Due   Hepatitis B Vaccines 19-59 Average Risk (1 of 3 - 19+ 3-dose series) Never done   COVID-19 Vaccine (3 - Pfizer risk series) 10/21/2019   DTaP/Tdap/Td (2 - Td or Tdap) 08/03/2021     Review of Systems  Physical Exam   BP 136/84   Pulse 76   Temp 98.1 F (36.7 C) (Temporal)   Ht 5' 7 (1.702 m)   Wt 217 lb (98.4 kg)   SpO2 98%   BMI 33.99 kg/m    Lab Results  Component Value Date   CD4TCELL 31 (L) 05/13/2024   Lab Results  Component Value Date   CD4TABS 575 05/13/2024   CD4TABS 534 11/11/2023   CD4TABS 546 04/29/2023   Lab Results  Component Value Date   HIV1RNAQUANT NOT DETECTED 05/13/2024   Lab Results  Component Value Date   HEPBSAB POS (A) 01/10/2016   Lab Results  Component Value Date   LABRPR NON-REACTIVE 05/13/2024    CBC Lab Results  Component Value Date   WBC 5.7 05/13/2024   RBC 5.00 05/13/2024   HGB 16.4 05/13/2024   HCT 49.1 05/13/2024   PLT 287 05/13/2024   MCV 98.2 05/13/2024   MCH 32.8 05/13/2024   MCHC 33.4 05/13/2024   RDW 12.2 05/13/2024   LYMPHSABS 1,994 04/29/2023   MONOABS 0.4 06/10/2014   EOSABS 68 05/13/2024    BMET Lab Results  Component Value Date  NA 138 05/13/2024   K 4.2 05/13/2024   CL 101 05/13/2024   CO2 29 05/13/2024   GLUCOSE 83 05/13/2024   BUN 17 05/13/2024   CREATININE 0.81 05/13/2024   CALCIUM  9.6 05/13/2024   GFRNONAA 100 09/15/2020   GFRAA 115 09/15/2020      Assessment and Plan  Crestor  20 Tricor  145 Already had flu, recommend covid vaccine

## 2024-06-17 ENCOUNTER — Other Ambulatory Visit: Payer: Self-pay

## 2024-06-19 ENCOUNTER — Other Ambulatory Visit (HOSPITAL_COMMUNITY): Payer: Self-pay

## 2024-06-22 ENCOUNTER — Other Ambulatory Visit: Payer: Self-pay

## 2024-06-22 NOTE — Progress Notes (Signed)
 Specialty Pharmacy Refill Coordination Note  Kevin Keller is a 43 y.o. male contacted today regarding refills of specialty medication(s) Emtricitab-Rilpivir-Tenofov AF (Odefsey )   Patient requested [name] at [facility] at [place] date: 06/23/24   Medication will be filled on 10.20.25.

## 2024-07-15 ENCOUNTER — Other Ambulatory Visit: Payer: Self-pay

## 2024-07-20 ENCOUNTER — Other Ambulatory Visit: Payer: Self-pay

## 2024-07-20 NOTE — Progress Notes (Signed)
 Specialty Pharmacy Refill Coordination Note  Kevin Keller is a 43 y.o. male contacted today regarding refills of specialty medication(s) Emtricitab-Rilpivir-Tenofov AF (Odefsey )   Patient requested [name] at [facility] at [place] date: 07/24/24   Medication will be filled on: 07/23/24

## 2024-07-22 ENCOUNTER — Other Ambulatory Visit: Payer: Self-pay

## 2024-08-14 ENCOUNTER — Other Ambulatory Visit: Payer: Self-pay

## 2024-08-14 NOTE — Progress Notes (Signed)
 Specialty Pharmacy Refill Coordination Note  Kevin Keller is a 43 y.o. male contacted today regarding refills of specialty medication(s) Emtricitab-Rilpivir-Tenofov AF (Odefsey )   Patient requested [name] at [facility] at [place] date: 08/18/24   Medication will be filled on: 08/17/24

## 2024-08-17 ENCOUNTER — Other Ambulatory Visit: Payer: Self-pay

## 2024-08-17 ENCOUNTER — Ambulatory Visit: Admitting: Internal Medicine

## 2024-08-17 ENCOUNTER — Other Ambulatory Visit (HOSPITAL_COMMUNITY): Payer: Self-pay

## 2024-08-17 ENCOUNTER — Encounter: Payer: Self-pay | Admitting: Internal Medicine

## 2024-08-17 VITALS — BP 144/81 | HR 75 | Temp 99.1°F | Resp 16 | Wt 218.2 lb

## 2024-08-17 DIAGNOSIS — Z79899 Other long term (current) drug therapy: Secondary | ICD-10-CM | POA: Diagnosis not present

## 2024-08-17 DIAGNOSIS — B20 Human immunodeficiency virus [HIV] disease: Secondary | ICD-10-CM

## 2024-08-17 DIAGNOSIS — E785 Hyperlipidemia, unspecified: Secondary | ICD-10-CM | POA: Diagnosis not present

## 2024-08-17 DIAGNOSIS — I10 Essential (primary) hypertension: Secondary | ICD-10-CM | POA: Diagnosis not present

## 2024-08-17 DIAGNOSIS — Z23 Encounter for immunization: Secondary | ICD-10-CM

## 2024-08-17 DIAGNOSIS — Z113 Encounter for screening for infections with a predominantly sexual mode of transmission: Secondary | ICD-10-CM | POA: Diagnosis not present

## 2024-08-17 NOTE — Progress Notes (Unsigned)
°  RFV: 3 month follow up for HIV disease  Patient ID: Kevin Keller, male   DOB: Jan 23, 1981, 43 y.o.   MRN: [id number]  HPI Minard is a 43yo M with well controlled hiv disease, CD 4 Count 575/VL<20 in September 2025 on odefsey . Also was started crestor  and tricor  for hyperlipidemia and hyper TG at last visit  Outpatient Encounter Medications as of 08/17/2024  Medication Sig   emtricitabine -rilpivir-tenofovir  AF (ODEFSEY ) 200-25-25 MG TABS tablet Take 1 tablet by mouth daily.   fenofibrate  (TRICOR ) 145 MG tablet Take 1 tablet (145 mg total) by mouth daily.   Multiple Vitamin (MULTIVITAMIN) tablet Take 1 tablet by mouth daily.   Omega-3 Fatty Acids (FISH OIL PO) Take by mouth.   rosuvastatin  (CRESTOR ) 20 MG tablet Take 1 tablet (20 mg total) by mouth daily.   No facility-administered encounter medications on file as of 08/17/2024.     Patient Active Problem List   Diagnosis Date Noted   Health care maintenance 09/20/2021   Mass of subcutaneous tissue of back 11/24/2020   Right low back pain 10/20/2020   Attention deficit hyperactivity disorder (ADHD), predominantly hyperactive type 10/07/2019   Scrotal swelling 10/07/2019   Umbilical hernia 11/10/2018   Tinea cruris 11/20/2017   Obesity 01/17/2017   Dyslipidemia 01/24/2016   History of genital warts 07/06/2014   History of chlamydia infection 07/06/2014   H/O seasonal allergies 07/05/2014   Human immunodeficiency virus (HIV) disease (HCC) 05/07/2014     Health Maintenance Due  Topic Date Due   Hepatitis B Vaccines 19-59 Average Risk (1 of 3 - 19+ 3-dose series) Never done   COVID-19 Vaccine (3 - Pfizer risk series) 10/21/2019   DTaP/Tdap/Td (2 - Td or Tdap) 08/03/2021     Review of Systems  Physical Exam   BP (!) 146/90   Pulse 75   Temp 99.1 F (37.3 C) (Oral)   Resp 16   Wt 218 lb 3.2 oz (99 kg)   SpO2 97%   BMI 34.17 kg/m    Lab Results  Component Value Date   CD4TCELL 31 (L) 05/13/2024   Lab Results   Component Value Date   CD4TABS 575 05/13/2024   CD4TABS 534 11/11/2023   CD4TABS 546 04/29/2023   Lab Results  Component Value Date   HIV1RNAQUANT NOT DETECTED 05/13/2024   Lab Results  Component Value Date   HEPBSAB POS (A) 01/10/2016   Lab Results  Component Value Date   LABRPR NON-REACTIVE 05/13/2024    CBC Lab Results  Component Value Date   WBC 5.7 05/13/2024   RBC 5.00 05/13/2024   HGB 16.4 05/13/2024   HCT 49.1 05/13/2024   PLT 287 05/13/2024   MCV 98.2 05/13/2024   MCH 32.8 05/13/2024   MCHC 33.4 05/13/2024   RDW 12.2 05/13/2024   LYMPHSABS 1,994 04/29/2023   MONOABS 0.4 06/10/2014   EOSABS 68 05/13/2024    BMET Lab Results  Component Value Date   NA 138 05/13/2024   K 4.2 05/13/2024   CL 101 05/13/2024   CO2 29 05/13/2024   GLUCOSE 83 05/13/2024   BUN 17 05/13/2024   CREATININE 0.81 05/13/2024   CALCIUM  9.6 05/13/2024   GFRNONAA 100 09/15/2020   GFRAA 115 09/15/2020      Assessment and Plan Urine cytology tpad Already received hpv Repeat BP Defer covid vaccine for now At next visit will do cholesterol

## 2024-09-09 ENCOUNTER — Other Ambulatory Visit: Payer: Self-pay

## 2024-09-10 ENCOUNTER — Other Ambulatory Visit: Payer: Self-pay

## 2024-09-11 ENCOUNTER — Other Ambulatory Visit: Payer: Self-pay

## 2024-09-15 ENCOUNTER — Other Ambulatory Visit: Payer: Self-pay

## 2024-09-15 ENCOUNTER — Other Ambulatory Visit: Payer: Self-pay | Admitting: Pharmacy Technician

## 2024-09-15 NOTE — Progress Notes (Signed)
 Specialty Pharmacy Refill Coordination Note  Kevin Keller is a 44 y.o. male contacted today regarding refills of specialty medication(s) Emtricitab-Rilpivir-Tenofov AF (Odefsey )   Patient requested [name] at [facility] at [place] date: 09/16/24   Medication will be filled on: 09/15/24

## 2024-11-02 ENCOUNTER — Other Ambulatory Visit

## 2024-11-16 ENCOUNTER — Ambulatory Visit: Payer: Self-pay | Admitting: Internal Medicine
